# Patient Record
Sex: Male | Born: 1955 | Race: White | Hispanic: No | Marital: Married | Smoking: Never smoker
Health system: Southern US, Community
[De-identification: ages and names within clinical notes are randomized; demographics above are authoritative.]

## PROBLEM LIST (undated history)

## (undated) DIAGNOSIS — I10 Essential (primary) hypertension: Secondary | ICD-10-CM

## (undated) DIAGNOSIS — R569 Unspecified convulsions: Secondary | ICD-10-CM

## (undated) DIAGNOSIS — G40909 Epilepsy, unspecified, not intractable, without status epilepticus: Secondary | ICD-10-CM

## (undated) DIAGNOSIS — K219 Gastro-esophageal reflux disease without esophagitis: Secondary | ICD-10-CM

## (undated) HISTORY — PX: KIDNEY SURGERY: SHX687

---
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
## 2002-07-28 ENCOUNTER — Ambulatory Visit (HOSPITAL_BASED_OUTPATIENT_CLINIC_OR_DEPARTMENT_OTHER): Admission: RE | Admit: 2002-07-28 | Discharge: 2002-07-28 | Payer: Self-pay | Admitting: Urology

## 2005-11-30 ENCOUNTER — Emergency Department (HOSPITAL_COMMUNITY): Admission: EM | Admit: 2005-11-30 | Discharge: 2005-12-01 | Payer: Self-pay | Admitting: Emergency Medicine

## 2007-12-06 ENCOUNTER — Encounter (HOSPITAL_BASED_OUTPATIENT_CLINIC_OR_DEPARTMENT_OTHER): Payer: Medicaid Other

## 2008-01-24 ENCOUNTER — Other Ambulatory Visit (HOSPITAL_BASED_OUTPATIENT_CLINIC_OR_DEPARTMENT_OTHER): Payer: Self-pay | Admitting: Gastroenterology

## 2008-01-24 ENCOUNTER — Encounter (HOSPITAL_BASED_OUTPATIENT_CLINIC_OR_DEPARTMENT_OTHER): Payer: Medicaid Other | Admitting: Registered"

## 2008-01-24 ENCOUNTER — Other Ambulatory Visit (HOSPITAL_BASED_OUTPATIENT_CLINIC_OR_DEPARTMENT_OTHER): Payer: Medicaid Other

## 2008-01-24 DIAGNOSIS — Z01818 Encounter for other preprocedural examination: Secondary | ICD-10-CM

## 2008-01-24 DIAGNOSIS — R188 Other ascites: Secondary | ICD-10-CM

## 2008-01-24 DIAGNOSIS — K769 Liver disease, unspecified: Secondary | ICD-10-CM

## 2008-01-24 DIAGNOSIS — K7030 Alcoholic cirrhosis of liver without ascites: Secondary | ICD-10-CM

## 2008-01-24 LAB — US ABDOMEN DOPPLER COMPLETE

## 2008-05-03 ENCOUNTER — Encounter (HOSPITAL_BASED_OUTPATIENT_CLINIC_OR_DEPARTMENT_OTHER): Payer: Medicaid Other | Admitting: Gastroenterology

## 2008-05-03 DIAGNOSIS — K766 Portal hypertension: Secondary | ICD-10-CM

## 2008-05-03 DIAGNOSIS — K7030 Alcoholic cirrhosis of liver without ascites: Secondary | ICD-10-CM

## 2008-05-03 DIAGNOSIS — R188 Other ascites: Secondary | ICD-10-CM

## 2009-09-13 ENCOUNTER — Other Ambulatory Visit
Admit: 2009-09-13 | Discharge: 2009-09-13 | Disposition: A | Discharge status: Home / Self Care | Payer: Medicaid Other | Attending: Plastic Surgery within the Head & Neck | Admitting: Plastic Surgery within the Head & Neck

## 2009-09-13 ENCOUNTER — Other Ambulatory Visit (HOSPITAL_BASED_OUTPATIENT_CLINIC_OR_DEPARTMENT_OTHER): Payer: Self-pay | Admitting: Plastic Surgery within the Head & Neck

## 2009-09-13 DIAGNOSIS — C779 Secondary and unspecified malignant neoplasm of lymph node, unspecified: Secondary | ICD-10-CM

## 2009-09-16 LAB — PATHOLOGY, SURGICAL

## 2009-10-02 ENCOUNTER — Ambulatory Visit
Payer: Medicaid Other | Attending: Plastic Surgery within the Head & Neck | Admitting: Plastic Surgery within the Head & Neck

## 2009-10-02 DIAGNOSIS — I889 Nonspecific lymphadenitis, unspecified: Secondary | ICD-10-CM | POA: Insufficient documentation

## 2009-10-02 DIAGNOSIS — C090 Malignant neoplasm of tonsillar fossa: Secondary | ICD-10-CM | POA: Insufficient documentation

## 2009-10-10 ENCOUNTER — Ambulatory Visit: Payer: Medicaid Other | Attending: Neurological Surgery | Admitting: Internal Medicine

## 2009-10-10 ENCOUNTER — Ambulatory Visit (HOSPITAL_BASED_OUTPATIENT_CLINIC_OR_DEPARTMENT_OTHER): Payer: Medicaid Other

## 2009-10-10 ENCOUNTER — Ambulatory Visit (HOSPITAL_BASED_OUTPATIENT_CLINIC_OR_DEPARTMENT_OTHER): Payer: Medicaid Other | Admitting: Registered Nurse

## 2009-10-10 ENCOUNTER — Encounter (HOSPITAL_BASED_OUTPATIENT_CLINIC_OR_DEPARTMENT_OTHER): Payer: Medicaid Other | Admitting: Physician Assistant

## 2009-10-10 ENCOUNTER — Other Ambulatory Visit (HOSPITAL_BASED_OUTPATIENT_CLINIC_OR_DEPARTMENT_OTHER): Payer: Self-pay | Admitting: Neurological Surgery

## 2009-10-10 DIAGNOSIS — I10 Essential (primary) hypertension: Secondary | ICD-10-CM

## 2009-10-10 DIAGNOSIS — C090 Malignant neoplasm of tonsillar fossa: Secondary | ICD-10-CM | POA: Insufficient documentation

## 2009-10-10 DIAGNOSIS — Z01818 Encounter for other preprocedural examination: Secondary | ICD-10-CM | POA: Insufficient documentation

## 2009-10-10 DIAGNOSIS — F17200 Nicotine dependence, unspecified, uncomplicated: Secondary | ICD-10-CM | POA: Insufficient documentation

## 2009-10-10 DIAGNOSIS — R220 Localized swelling, mass and lump, head: Secondary | ICD-10-CM | POA: Insufficient documentation

## 2009-10-10 DIAGNOSIS — K7030 Alcoholic cirrhosis of liver without ascites: Secondary | ICD-10-CM | POA: Insufficient documentation

## 2009-10-10 DIAGNOSIS — N200 Calculus of kidney: Secondary | ICD-10-CM | POA: Insufficient documentation

## 2009-10-10 DIAGNOSIS — K219 Gastro-esophageal reflux disease without esophagitis: Secondary | ICD-10-CM | POA: Insufficient documentation

## 2009-10-10 DIAGNOSIS — J984 Other disorders of lung: Secondary | ICD-10-CM | POA: Insufficient documentation

## 2009-10-10 DIAGNOSIS — Z01810 Encounter for preprocedural cardiovascular examination: Secondary | ICD-10-CM | POA: Insufficient documentation

## 2009-10-10 DIAGNOSIS — R599 Enlarged lymph nodes, unspecified: Secondary | ICD-10-CM | POA: Insufficient documentation

## 2009-10-10 DIAGNOSIS — C969 Malignant neoplasm of lymphoid, hematopoietic and related tissue, unspecified: Secondary | ICD-10-CM | POA: Insufficient documentation

## 2009-10-14 LAB — PR DIAGNOSTIC COMPUTED TOMOGRAPHY THORAX W/O CNTRST

## 2009-10-18 ENCOUNTER — Inpatient Hospital Stay (HOSPITAL_COMMUNITY): Payer: Medicaid Other | Admitting: Plastic Surgery within the Head & Neck

## 2009-10-18 ENCOUNTER — Other Ambulatory Visit (HOSPITAL_BASED_OUTPATIENT_CLINIC_OR_DEPARTMENT_OTHER): Payer: Self-pay | Admitting: Plastic Surgery within the Head & Neck

## 2009-10-18 ENCOUNTER — Inpatient Hospital Stay
Admission: RE | Admit: 2009-10-18 | Discharge: 2009-10-21 | DRG: 406 | Disposition: A | Discharge status: Home / Self Care | Payer: Medicaid Other | Attending: Plastic Surgery within the Head & Neck | Admitting: Plastic Surgery within the Head & Neck

## 2009-10-18 DIAGNOSIS — C969 Malignant neoplasm of lymphoid, hematopoietic and related tissue, unspecified: Secondary | ICD-10-CM

## 2009-10-18 DIAGNOSIS — K140 Glossitis: Secondary | ICD-10-CM

## 2009-10-18 DIAGNOSIS — Z880 Allergy status to penicillin: Secondary | ICD-10-CM

## 2009-10-18 DIAGNOSIS — K219 Gastro-esophageal reflux disease without esophagitis: Secondary | ICD-10-CM | POA: Diagnosis present

## 2009-10-18 DIAGNOSIS — C770 Secondary and unspecified malignant neoplasm of lymph nodes of head, face and neck: Secondary | ICD-10-CM

## 2009-10-18 DIAGNOSIS — I10 Essential (primary) hypertension: Secondary | ICD-10-CM | POA: Diagnosis present

## 2009-10-18 DIAGNOSIS — K7030 Alcoholic cirrhosis of liver without ascites: Secondary | ICD-10-CM | POA: Diagnosis present

## 2009-10-18 DIAGNOSIS — F1020 Alcohol dependence, uncomplicated: Secondary | ICD-10-CM | POA: Diagnosis present

## 2009-10-18 DIAGNOSIS — Z87891 Personal history of nicotine dependence: Secondary | ICD-10-CM

## 2009-10-18 DIAGNOSIS — C801 Malignant (primary) neoplasm, unspecified: Secondary | ICD-10-CM | POA: Diagnosis present

## 2009-10-18 DIAGNOSIS — I8390 Asymptomatic varicose veins of unspecified lower extremity: Secondary | ICD-10-CM | POA: Diagnosis present

## 2009-10-18 DIAGNOSIS — Z8669 Personal history of other diseases of the nervous system and sense organs: Secondary | ICD-10-CM

## 2009-10-18 DIAGNOSIS — C099 Malignant neoplasm of tonsil, unspecified: Secondary | ICD-10-CM

## 2009-10-18 DIAGNOSIS — Z87442 Personal history of urinary calculi: Secondary | ICD-10-CM

## 2009-10-19 DIAGNOSIS — I10 Essential (primary) hypertension: Secondary | ICD-10-CM

## 2009-10-19 DIAGNOSIS — K7030 Alcoholic cirrhosis of liver without ascites: Secondary | ICD-10-CM

## 2009-10-25 ENCOUNTER — Encounter (HOSPITAL_BASED_OUTPATIENT_CLINIC_OR_DEPARTMENT_OTHER): Payer: Medicaid Other | Admitting: Registered Nurse

## 2009-10-29 ENCOUNTER — Ambulatory Visit: Payer: Medicaid Other | Attending: Registered Nurse | Admitting: Registered Nurse

## 2009-10-29 DIAGNOSIS — C090 Malignant neoplasm of tonsillar fossa: Secondary | ICD-10-CM | POA: Insufficient documentation

## 2009-10-29 DIAGNOSIS — R220 Localized swelling, mass and lump, head: Secondary | ICD-10-CM | POA: Insufficient documentation

## 2009-11-18 ENCOUNTER — Encounter (HOSPITAL_BASED_OUTPATIENT_CLINIC_OR_DEPARTMENT_OTHER): Payer: Medicaid Other | Admitting: Registered Nurse

## 2009-11-25 ENCOUNTER — Ambulatory Visit: Payer: Medicaid Other | Attending: Registered Nurse | Admitting: Registered Nurse

## 2009-11-25 DIAGNOSIS — C090 Malignant neoplasm of tonsillar fossa: Secondary | ICD-10-CM | POA: Insufficient documentation

## 2009-11-25 DIAGNOSIS — Z09 Encounter for follow-up examination after completed treatment for conditions other than malignant neoplasm: Secondary | ICD-10-CM | POA: Insufficient documentation

## 2010-08-19 ENCOUNTER — Ambulatory Visit
Payer: Medicaid Other | Attending: Plastic Surgery within the Head & Neck | Admitting: Plastic Surgery within the Head & Neck

## 2010-08-19 DIAGNOSIS — Z5189 Encounter for other specified aftercare: Secondary | ICD-10-CM | POA: Insufficient documentation

## 2010-08-19 DIAGNOSIS — Z85819 Personal history of malignant neoplasm of unspecified site of lip, oral cavity, and pharynx: Secondary | ICD-10-CM

## 2010-12-17 ENCOUNTER — Ambulatory Visit (HOSPITAL_BASED_OUTPATIENT_CLINIC_OR_DEPARTMENT_OTHER): Admit: 2010-12-17 | Discharge: 2010-12-17 | Disposition: A | Discharge status: Home / Self Care | Payer: Self-pay

## 2011-01-13 ENCOUNTER — Ambulatory Visit: Payer: No Typology Code available for payment source | Attending: Otolaryngology | Admitting: Otolaryngology

## 2011-01-13 ENCOUNTER — Ambulatory Visit (HOSPITAL_BASED_OUTPATIENT_CLINIC_OR_DEPARTMENT_OTHER): Payer: Medicaid Other | Admitting: Otolaryngology

## 2011-01-13 DIAGNOSIS — M5382 Other specified dorsopathies, cervical region: Secondary | ICD-10-CM | POA: Insufficient documentation

## 2011-01-13 DIAGNOSIS — M6281 Muscle weakness (generalized): Secondary | ICD-10-CM

## 2011-01-30 ENCOUNTER — Encounter: Payer: Self-pay | Admitting: *Deleted

## 2011-01-30 ENCOUNTER — Inpatient Hospital Stay (HOSPITAL_COMMUNITY)
Admission: AD | Admit: 2011-01-30 | Discharge: 2011-02-03 | DRG: 445 | Disposition: A | Discharge status: Home / Self Care | Payer: PRIVATE HEALTH INSURANCE | Source: Other Acute Inpatient Hospital | Attending: Internal Medicine | Admitting: Internal Medicine

## 2011-01-30 ENCOUNTER — Emergency Department (INDEPENDENT_AMBULATORY_CARE_PROVIDER_SITE_OTHER): Payer: PRIVATE HEALTH INSURANCE

## 2011-01-30 ENCOUNTER — Other Ambulatory Visit: Payer: Self-pay

## 2011-01-30 ENCOUNTER — Emergency Department (HOSPITAL_BASED_OUTPATIENT_CLINIC_OR_DEPARTMENT_OTHER)
Admission: EM | Admit: 2011-01-30 | Discharge: 2011-01-30 | Disposition: A | Discharge status: Other Acute Inpatient Hospital | Payer: PRIVATE HEALTH INSURANCE | Source: Home / Self Care | Attending: Emergency Medicine | Admitting: Emergency Medicine

## 2011-01-30 DIAGNOSIS — I10 Essential (primary) hypertension: Secondary | ICD-10-CM | POA: Diagnosis present

## 2011-01-30 DIAGNOSIS — K810 Acute cholecystitis: Principal | ICD-10-CM | POA: Diagnosis present

## 2011-01-30 DIAGNOSIS — K828 Other specified diseases of gallbladder: Secondary | ICD-10-CM | POA: Diagnosis present

## 2011-01-30 DIAGNOSIS — R0789 Other chest pain: Secondary | ICD-10-CM | POA: Diagnosis present

## 2011-01-30 DIAGNOSIS — R0602 Shortness of breath: Secondary | ICD-10-CM | POA: Insufficient documentation

## 2011-01-30 DIAGNOSIS — Z79899 Other long term (current) drug therapy: Secondary | ICD-10-CM

## 2011-01-30 DIAGNOSIS — D72829 Elevated white blood cell count, unspecified: Secondary | ICD-10-CM | POA: Diagnosis present

## 2011-01-30 DIAGNOSIS — G40909 Epilepsy, unspecified, not intractable, without status epilepticus: Secondary | ICD-10-CM | POA: Diagnosis present

## 2011-01-30 DIAGNOSIS — R079 Chest pain, unspecified: Secondary | ICD-10-CM

## 2011-01-30 DIAGNOSIS — K219 Gastro-esophageal reflux disease without esophagitis: Secondary | ICD-10-CM | POA: Diagnosis present

## 2011-01-30 DIAGNOSIS — N179 Acute kidney failure, unspecified: Secondary | ICD-10-CM | POA: Diagnosis present

## 2011-01-30 DIAGNOSIS — I517 Cardiomegaly: Secondary | ICD-10-CM

## 2011-01-30 HISTORY — DX: Essential (primary) hypertension: I10

## 2011-01-30 HISTORY — DX: Unspecified convulsions: R56.9

## 2011-01-30 HISTORY — DX: Gastro-esophageal reflux disease without esophagitis: K21.9

## 2011-01-30 LAB — BASIC METABOLIC PANEL
BUN: 11 mg/dL (ref 6–23)
CO2: 25 mEq/L (ref 19–32)
Calcium: 9.4 mg/dL (ref 8.4–10.5)
Chloride: 102 mEq/L (ref 96–112)
Creatinine, Ser: 1.1 mg/dL (ref 0.50–1.35)

## 2011-01-30 LAB — DIFFERENTIAL
Basophils Absolute: 0 10*3/uL (ref 0.0–0.1)
Basophils Relative: 0 % (ref 0–1)
Eosinophils Relative: 3 % (ref 0–5)
Lymphocytes Relative: 35 % (ref 12–46)
Monocytes Absolute: 1 10*3/uL (ref 0.1–1.0)
Monocytes Relative: 9 % (ref 3–12)

## 2011-01-30 LAB — CBC
HCT: 44.6 % (ref 39.0–52.0)
Hemoglobin: 15.5 g/dL (ref 13.0–17.0)
MCHC: 34.8 g/dL (ref 30.0–36.0)
MCV: 87.8 fL (ref 78.0–100.0)
RDW: 12.9 % (ref 11.5–15.5)

## 2011-01-30 LAB — D-DIMER, QUANTITATIVE: D-Dimer, Quant: <0.22 ug/mL-FEU (ref 0.00–0.48)

## 2011-01-30 LAB — CARDIAC PANEL(CRET KIN+CKTOT+MB+TROPI)
Relative Index: INVALID (ref 0.0–2.5)
Total CK: 98 U/L (ref 7–232)

## 2011-01-30 MED ORDER — KETOROLAC TROMETHAMINE 30 MG/ML IJ SOLN
INTRAMUSCULAR | Status: AC
  Administered 2011-01-30: 30 mg
  Filled 2011-01-30: qty 1

## 2011-01-30 MED ORDER — MORPHINE SULFATE 4 MG/ML IJ SOLN
4.0000 mg | Freq: Once | INTRAMUSCULAR | Status: AC
  Administered 2011-01-30: 4 mg via INTRAVENOUS
  Filled 2011-01-30: qty 1

## 2011-01-30 MED ORDER — ONDANSETRON HCL 4 MG/2ML IJ SOLN
4.0000 mg | Freq: Once | INTRAMUSCULAR | Status: AC
  Administered 2011-01-30: 4 mg via INTRAVENOUS
  Filled 2011-01-30 (×2): qty 2

## 2011-01-30 MED ORDER — LORAZEPAM 2 MG/ML IJ SOLN
0.5000 mg | Freq: Once | INTRAMUSCULAR | Status: AC
  Administered 2011-01-30: 0.5 mg via INTRAVENOUS
  Filled 2011-01-30: qty 1

## 2011-01-30 MED ORDER — HYDROCODONE-ACETAMINOPHEN 5-325 MG PO TABS: 2.0000 | ORAL_TABLET | ORAL | Status: AC | PRN

## 2011-01-30 MED ORDER — MORPHINE SULFATE 4 MG/ML IJ SOLN
INTRAMUSCULAR | Status: AC
  Filled 2011-01-30: qty 1

## 2011-01-30 MED ORDER — MORPHINE SULFATE 4 MG/ML IJ SOLN
4.0000 mg | Freq: Once | INTRAMUSCULAR | Status: AC
  Administered 2011-01-30: 4 mg via INTRAVENOUS

## 2011-01-30 MED ORDER — HYDROMORPHONE HCL 1 MG/ML IJ SOLN: 1.0000 mg | Freq: Once | INTRAMUSCULAR | Status: DC

## 2011-01-30 MED ORDER — ONDANSETRON HCL 4 MG/2ML IJ SOLN
INTRAMUSCULAR | Status: AC
  Administered 2011-01-30: 4 mg via INTRAVENOUS
  Filled 2011-01-30: qty 2

## 2011-01-30 MED ORDER — ASPIRIN 81 MG PO CHEW
324.0000 mg | CHEWABLE_TABLET | Freq: Once | ORAL | Status: AC
  Administered 2011-01-30: 324 mg via ORAL
  Filled 2011-01-30: qty 4

## 2011-01-30 MED ORDER — KETOROLAC TROMETHAMINE 30 MG/ML IJ SOLN
30.0000 mg | Freq: Once | INTRAMUSCULAR | Status: AC
  Administered 2011-01-30: 30 mg via INTRAVENOUS
  Filled 2011-01-30: qty 1

## 2011-01-30 MED ORDER — ONDANSETRON HCL 4 MG/2ML IJ SOLN
4.0000 mg | Freq: Once | INTRAMUSCULAR | Status: AC
  Administered 2011-01-30: 4 mg via INTRAVENOUS

## 2011-01-30 NOTE — ED Notes (Signed)
Pt states he coughed and had a sharp pain in his right anterior chest.

## 2011-01-30 NOTE — ED Provider Notes (Signed)
History     CSN: [id number] Arrival date & time: 01/30/2011 11:07 AM  Chief Complaint  Patient presents with  . Chest Pain     HPI 55 year-old male history of seizure disorder number hypertension presents with chest pain. Patient complaining of sudden onset left-sided sharp chest pressure with radiation to his bilateral jaws and "head " since about 40 minutes prior to arrival. He states that his chest pain is associated with shortness of breath. He denies nausea, vomiting, diaphoresis. He denies radiation of pain to his back. He denies fever, chills, cough. Denies history of similar. Does have history of GERD but states that this is different. Also has a history of anxiety and states that this is different from that as well. He does report that he is under increased stress at his job. He owns 2 businesses. He denies numbness, tingling, weakness of his extremities. Denies h/o VTE in self or family. No recent hosp/surg/immob. No h/o cancer. Denies exogenous hormone use, no leg pain or swelling. Has been compiled with his antiepileptics last seizure was over a year ago.  Past Medical History  Diagnosis Date  . Seizures   . Hypertension   . GERD (gastroesophageal reflux disease)     Past Surgical History  Procedure Date  . Kidney surgery     No family history on file.  History  Substance Use Topics  . Smoking status: Never Smoker   . Smokeless tobacco: Not on file  . Alcohol Use: No      Review of Systems Negative except as noted history of present illness  Allergies  Review of patient's allergies indicates no known allergies.  Home Medications   Current Outpatient Rx  Name Route Sig Dispense Refill  . LAMICTAL PO Oral Take by mouth.      Marland Kitchen KEPPRA PO Oral Take by mouth.      . RABEPRAZOLE SODIUM 20 MG PO TBEC Oral Take 20 mg by mouth daily.        BP 120/92  Pulse 92  Temp(Src) 97.9 F (36.6 C) (Oral)  Resp 24  SpO2 97%  Physical Exam  Nursing note and vitals  reviewed. Constitutional: He is oriented to person, place, and time. He appears well-developed and well-nourished. No distress.       Appears anxious  HENT:  Head: Atraumatic.       His membranes are dry posterior oropharynx unremarkable  Eyes: Conjunctivae are normal. Pupils are equal, round, and reactive to light.  Neck: Neck supple.  Cardiovascular: Normal rate, regular rhythm, normal heart sounds and intact distal pulses.  Exam reveals no gallop and no friction rub.   No murmur heard. Pulmonary/Chest: Effort normal. No respiratory distress. He has no wheezes. He has no rales. He exhibits tenderness.       Left-sided chest tenderness which he states does produce his pain somewhat. He has worsening pain when he moves during physical exam.  Abdominal: Soft. Bowel sounds are normal. There is no tenderness. There is no rebound and no guarding.  Musculoskeletal: Normal range of motion. He exhibits no edema and no tenderness.  Neurological: He is alert and oriented to person, place, and time. No cranial nerve deficit. He exhibits normal muscle tone. Coordination normal.  Skin: Skin is warm and dry. No rash noted.  Psychiatric:       Anxious appearing    ED Course  Procedures (including critical care time)  Labs Reviewed  CBC - Abnormal; Notable for the following:  WBC 11.2 (*)    All other components within normal limits  BASIC METABOLIC PANEL - Abnormal; Notable for the following:    Glucose, Bld 147 (*)    GFR calc non Af Amer 75 (*)    GFR calc Af Amer 87 (*)    All other components within normal limits  DIFFERENTIAL  CARDIAC PANEL(CRET KIN+CKTOT+MB+TROPI)  D-DIMER, QUANTITATIVE   Dg Chest 2 View  01/30/2011  *RADIOLOGY REPORT*  Clinical Data: Chest pain, shortness of breath, hypertension  CHEST - 2 VIEW  Comparison: None.  Findings: The lungs are clear.  Mediastinal contours appear normal. The heart is within upper limits of normal.  There are degenerative changes throughout  the thoracic spine.  IMPRESSION: No active lung disease.  Borderline cardiomegaly.  Original Report Authenticated By: Juline Patch, M.D.     No diagnosis found.   Date: 01/30/2011  Rate: 92   Rhythm: normal sinus rhythm  QRS Axis: normal  Intervals: normal  ST/T Wave abnormalities: normal  Conduction Disutrbances:none  Narrative Interpretation:   Old EKG Reviewed: unchanged    MDM  55 year old male history of anxiety under increased stress. Presents with left-sided chest pain. Ordered chest x-ray, labs. EKG unremarkable. I suspect that his chest pain is musculoskeletal and may be related to stress and anxiety. He has a TIMI score of 0. Will reassess after morphine and Ativan.  Stefano Gaul, MD  12:28 PM labs reviewed and unremarkable including cardiac enzymes. The patient is PERC negative and has a negative d-dimer.  3:28 PM  patient has feels somewhat improved after toradol. Will check second set of cardiac enzymes. Home if negative with PMD f/u.   Pt signed out to Dr. Juleen China. F/u CE. Home if negative with pain medication.  Stefano Gaul, MD        Forbes Cellar, MD 01/30/11 [phone]

## 2011-01-30 NOTE — ED Notes (Signed)
Report given to [facility] 

## 2011-01-30 NOTE — ED Provider Notes (Signed)
  Physical Exam  BP 105/77  Pulse 88  Temp(Src) 97.9 F (36.6 C) (Oral)  Resp 24  SpO2 95%  Physical Exam  ED Course  Procedures  MDM 54yM with ongoing CP. Initial EKG nondiagnostic and repeat without significant change. CEs wnl x2. D-dimer neg.  Suspect some component of anxiety. Explained to pt and wife that feel that low risk CP and w/u unremarkable. Discussed pursuiting provocative testing as outpt. Pt states that CP is now getting worse and would "feel safer" if admitted. Will discuss with medicine.    Raeford Razor, MD 01/30/11 1750

## 2011-01-30 NOTE — ED Notes (Signed)
Sudden onset chest pain, headache and sob. Pale on arrival. EKG and cardiac monitor on arrival to tx room. States he has been under increased stress with work.

## 2011-01-31 LAB — CARDIAC PANEL(CRET KIN+CKTOT+MB+TROPI)
CK, MB: 2.2 ng/mL (ref 0.3–4.0)
CK, MB: 2.1 ng/mL (ref 0.3–4.0)
Relative Index: INVALID (ref 0.0–2.5)
Total CK: 47 U/L (ref 7–232)
Total CK: 45 U/L (ref 7–232)
Total CK: 47 U/L (ref 7–232)
Troponin I: <0.3 ng/mL (ref <0.30)

## 2011-01-31 LAB — CBC
HCT: 42 % (ref 39.0–52.0)
MCHC: 33.6 g/dL (ref 30.0–36.0)
Platelets: 220 10*3/uL (ref 150–400)
RDW: 13.3 % (ref 11.5–15.5)
WBC: 14.1 10*3/uL — ABNORMAL HIGH (ref 4.0–10.5)

## 2011-01-31 LAB — COMPREHENSIVE METABOLIC PANEL
Albumin: 4.1 g/dL (ref 3.5–5.2)
BUN: 23 mg/dL (ref 6–23)
CO2: 28 mEq/L (ref 19–32)
Calcium: 9.7 mg/dL (ref 8.4–10.5)
Chloride: 101 mEq/L (ref 96–112)
Creatinine, Ser: 1.45 mg/dL — ABNORMAL HIGH (ref 0.50–1.35)
GFR calc non Af Amer: 54 mL/min — ABNORMAL LOW (ref >90)
Total Bilirubin: 0.7 mg/dL (ref 0.3–1.2)

## 2011-01-31 LAB — GLUCOSE, CAPILLARY: Glucose-Capillary: 119 mg/dL — ABNORMAL HIGH (ref 70–99)

## 2011-01-31 LAB — LIPASE, BLOOD: Lipase: 51 U/L (ref 11–59)

## 2011-02-01 ENCOUNTER — Inpatient Hospital Stay (HOSPITAL_COMMUNITY): Payer: PRIVATE HEALTH INSURANCE

## 2011-02-01 LAB — CBC
Platelets: 173 10*3/uL (ref 150–400)
RDW: 12.9 % (ref 11.5–15.5)
WBC: 11.4 10*3/uL — ABNORMAL HIGH (ref 4.0–10.5)

## 2011-02-01 LAB — RAPID URINE DRUG SCREEN, HOSP PERFORMED
Barbiturates: NOT DETECTED
Cocaine: NOT DETECTED
Tetrahydrocannabinol: POSITIVE — AB

## 2011-02-01 LAB — BASIC METABOLIC PANEL
Chloride: 101 mEq/L (ref 96–112)
GFR calc Af Amer: 74 mL/min — ABNORMAL LOW (ref >90)
Potassium: 4.3 mEq/L (ref 3.5–5.1)
Sodium: 138 mEq/L (ref 135–145)

## 2011-02-01 LAB — LIPID PANEL
Cholesterol: 138 mg/dL (ref 0–200)
HDL: 37 mg/dL — ABNORMAL LOW (ref >39)
Total CHOL/HDL Ratio: 3.7 RATIO
Triglycerides: 84 mg/dL (ref <150)

## 2011-02-02 ENCOUNTER — Inpatient Hospital Stay (HOSPITAL_COMMUNITY): Payer: PRIVATE HEALTH INSURANCE

## 2011-02-02 LAB — BASIC METABOLIC PANEL
GFR calc Af Amer: 79 mL/min — ABNORMAL LOW (ref >90)
GFR calc non Af Amer: 68 mL/min — ABNORMAL LOW (ref >90)
Glucose, Bld: 94 mg/dL (ref 70–99)
Potassium: 4 mEq/L (ref 3.5–5.1)
Sodium: 140 mEq/L (ref 135–145)

## 2011-02-02 LAB — CBC
Hemoglobin: 12.9 g/dL — ABNORMAL LOW (ref 13.0–17.0)
MCHC: 33.9 g/dL (ref 30.0–36.0)
RBC: 4.24 MIL/uL (ref 4.22–5.81)
WBC: 8.3 10*3/uL (ref 4.0–10.5)

## 2011-02-02 MED ORDER — TECHNETIUM TC 99M MEBROFENIN IV KIT
5.0000 | PACK | Freq: Once | INTRAVENOUS | Status: AC | PRN
  Administered 2011-02-02: 5 via INTRAVENOUS

## 2011-02-12 ENCOUNTER — Encounter (INDEPENDENT_AMBULATORY_CARE_PROVIDER_SITE_OTHER): Payer: Self-pay | Admitting: General Surgery

## 2011-02-12 ENCOUNTER — Ambulatory Visit (INDEPENDENT_AMBULATORY_CARE_PROVIDER_SITE_OTHER): Payer: PRIVATE HEALTH INSURANCE | Admitting: General Surgery

## 2011-02-12 VITALS — BP 132/86 | HR 70 | Temp 97.1°F | Resp 16 | Ht 72.0 in | Wt 168.6 lb

## 2011-02-12 DIAGNOSIS — K8020 Calculus of gallbladder without cholecystitis without obstruction: Secondary | ICD-10-CM

## 2011-02-12 DIAGNOSIS — K828 Other specified diseases of gallbladder: Secondary | ICD-10-CM

## 2011-02-12 NOTE — Patient Instructions (Signed)
Call us at [phone].  Stop taking the antibiotics        Laparoscopic Cholecystectomy Care After Refer to this sheet in the next few weeks. These instructions provide you with information on caring for yourself after your procedure. Your caregiver may also give you more specific instructions. Your treatment has been planned according to current medical practices, but problems sometimes occur. Call your caregiver if you have any problems or questions after your procedure. HOME CARE INSTRUCTIONS   Change bandages (dressings) as directed by your caregiver.   Keep the wound dry and clean. The wound may be washed gently with soap and water. Gently blot or dab the area dry.   Do not take baths or use swimming pools or hot tubs for 10 days, or as instructed by your caregiver.   Only take over-the-counter or prescription medicines for pain, discomfort, or fever as directed by your caregiver.   Continue your normal diet as directed by your caregiver.   Do not lift anything heavier than 25 pounds (11.5 kg), or as directed by your caregiver.   Do not play contact sports for 1 week, or as directed by your caregiver.  SEEK MEDICAL CARE IF:   There is redness, swelling, or increasing pain in the wound.   You notice yellowish-white fluid (pus) coming from the wound.   There is drainage from the wound that lasts longer than 1 day.   There is a bad smell coming from the wound or dressing.   The surgical cut (incision) breaks open.  SEEK IMMEDIATE MEDICAL CARE IF:   You develop a rash.   You have difficulty breathing.   You develop chest pain.   You develop any reaction or side effects to medicines given.   You have a fever.   You have increasing pain in the shoulders (shoulder strap areas).   You have dizzy episodes or faint while standing.   You develop severe abdominal pain.   You feel sick to your stomach (nauseous) or throw up (vomit) and this lasts for more than 1 day.  MAKE  SURE YOU:   Understand these instructions.   Will watch your condition.   Will get help right away if you are not doing well or get worse.  Document Released: 04/13/2005 Document Revised: 12/24/2010 Document Reviewed: 09/26/2010 [facility] Patient Information 2012 [place], [place].

## 2011-02-13 ENCOUNTER — Encounter (INDEPENDENT_AMBULATORY_CARE_PROVIDER_SITE_OTHER): Payer: Self-pay | Admitting: General Surgery

## 2011-02-13 NOTE — Progress Notes (Signed)
Chief Complaint  Patient presents with  . Other    Evaluate gallbladder    HPI Timothy Middleton is a 55 y.o. male.   HPI  55 year old Caucasian male referred by Dr. Lenise Arena for evaluation of a possible cholecystectomy. Around October 5, the patient developed acute onset left sided chest pain that radiated to his jaw. He also developed a headache. He thought he was having a heart attack. He went to [facility] and was subsequently and transferred to [facility] He was admitted by the hospitalists. He underwent serial cardiac enzymes monitoring which were all negative as well as a 12-lead EKG which demonstrated no changes. He was ruled out for myocardial infarction. However he did develop some right-sided abdominal pain. He also developed one episode of nausea and vomiting. He also states he had a large amount of belching. An ultrasound of his abdomen was performed followed by HIDA scan.  He denies any dysphasia, acholic stools, diarrhea, constipation, melena, or hematochezia. He denies any NSAIDs use. He does take medicine for reflux. He has not had any additional attacks since discharge. He denies any jaundice.  Past Medical History  Diagnosis Date  . Hypertension   . GERD (gastroesophageal reflux disease)   . Seizures     epileptic    Past Surgical History  Procedure Date  . Kidney surgery   . Ureterectomy 1964    No family history on file.  Social History History  Substance Use Topics  . Smoking status: Never Smoker   . Smokeless tobacco: Not on file  . Alcohol Use: No  Positive THC use.  No Known Allergies  Current Outpatient Prescriptions  Medication Sig Dispense Refill  . ciprofloxacin (CIPRO) 500 MG tablet       . HYDROcodone-acetaminophen (NORCO) 5-325 MG per tablet       . metroNIDAZOLE (FLAGYL) 500 MG tablet       . MICARDIS 80 MG tablet       . omeprazole (PRILOSEC) 20 MG capsule       . ALPRAZolam (XANAX) 0.5 MG tablet Take 0.125 mg by mouth  daily as needed.        . LamoTRIgine (LAMICTAL XR) 300 MG TB24 Take 1 tablet by mouth 2 (two) times daily.        Marland Kitchen levETIRAcetam (KEPPRA) 500 MG tablet Take 1,000 mg by mouth every 12 (twelve) hours.        . Multiple Vitamin (MULTIVITAMIN) tablet Take 1 tablet by mouth daily.        . RABEprazole (ACIPHEX) 20 MG tablet Take 20 mg by mouth daily as needed. For acid reflux or a large amount of acid        Review of Systems Review of Systems  Constitutional: Negative for fever, chills, appetite change and unexpected weight change (has lost 15lbs (planned)).  HENT: Negative for hearing loss, congestion, sore throat, trouble swallowing and voice change.   Eyes: Negative for visual disturbance.  Respiratory: Negative for cough and wheezing.   Cardiovascular: Negative for palpitations and leg swelling.  Gastrointestinal: Positive for abdominal pain. Negative for diarrhea, constipation, blood in stool, abdominal distention, anal bleeding and rectal pain.  Genitourinary: Negative for hematuria and difficulty urinating.  Musculoskeletal: Negative for arthralgias.  Skin: Negative for rash and wound.  Neurological: Positive for seizures (last sz many yrs ago). Negative for syncope, weakness and headaches.  Hematological: Negative for adenopathy. Does not bruise/bleed easily.  Psychiatric/Behavioral: Negative for confusion.    Blood  pressure 132/86, pulse 70, temperature 97.1 F (36.2 C), temperature source Temporal, resp. rate 16, height 6' (1.829 m), weight 168 lb 9.6 oz (76.476 kg).  Physical Exam Physical Exam  Vitals reviewed. Constitutional: He is oriented to person, place, and time. He appears well-developed and well-nourished. No distress.  HENT:  Head: Normocephalic and atraumatic.  Eyes: Conjunctivae are normal. No scleral icterus.  Neck: Normal range of motion. Neck supple. No thyromegaly present.  Cardiovascular: Normal rate, regular rhythm and normal heart sounds.     Pulmonary/Chest: Effort normal and breath sounds normal. No respiratory distress. He has no rales.  Abdominal: Soft. Bowel sounds are normal. He exhibits no distension. There is no tenderness. There is no guarding.  Musculoskeletal: Normal range of motion. He exhibits no edema.  Lymphadenopathy:    He has no cervical adenopathy.  Neurological: He is alert and oriented to person, place, and time. He exhibits normal muscle tone.  Skin: Skin is warm and dry.  Psychiatric: He has a normal mood and affect. His behavior is normal. Judgment and thought content normal.    Data Reviewed NUCLEAR MEDICINE HEPATOBILIARY IMAGING WITH GALLBLADDER EF   Technique: Sequential images of the abdomen were obtained out to 60  minutes following intravenous administration of  radiopharmaceutical. After the slow intravenous infusion of 1.58  uCg Cholecystokinin, the gallbladder ejection fraction was  determined.  Radiopharmaceutical: 5.0 mCi Tc-67m Choletec  Comparison: Abdominal ultrasound 02/01/2011.   Findings: Initial images demonstrate homogenous hepatic activity  and prompt visualization of the gallbladder and biliary system.  The stimulated portion of the study demonstrates limited  gallbladder contraction. There is progressive small bowel  activity. The gallbladder ejection fraction calculated at  approximately 30 minutes is 17.9%. Normal ejection fractions are  considered greater than 30%.  The patient did not experience symptoms during CCK administration.   IMPRESSION:  1. The cystic and common bile ducts are patent.  2. There is a decreased gallbladder ejection fraction of 18%  suggesting biliary dyskinesia. The patient did not have  progressive symptoms with CCK administration.   ABDOMINAL ULTRASOUND COMPLETE  Comparison: None  Findings:   Gallbladder: A 2.5 cm mobile gallstone is identified. There is  gallbladder wall thickening but no sonographic Murphy's sign or  pericholecystic  fluid.   Common Bile Duct: There is no evidence of intrahepatic or  extrahepatic biliary dilation. The CBD measures 5.0 mm in greatest  diameter.   Liver: The liver is within normal limits in parenchymal  echogenicity. No focal abnormalities are identified.  IVC: Appears normal.  Pancreas: Although the pancreas is difficult to visualize in its  entirety, no focal pancreatic abnormality is identified.  Spleen: Within normal limits in size and echotexture.  Right kidney: The right kidney is normal in size and parenchymal  echogenicity. There is no evidence of solid mass, hydronephrosis  or definite renal calculi. A 3 cm right upper pole cyst is present.  The right kidney measures 11.9 cm.  Left kidney: The left kidney is normal in size and parenchymal  echogenicity. There is no evidence of solid mass, hydronephrosis  or definite renal calculi. The left kidney measures 12.5 cm.  Abdominal Aorta: No abdominal aortic aneurysm identified.  There is no evidence of ascites.   IMPRESSION: 2.5 cm gallstone with gallbladder wall thickening. This  wall thickening is suspicious for acute cholecystitis but the  gallstone is mobile.  No evidence of biliary dilatation.  CBC    Component Value Date/Time   WBC 8.3 02/02/2011  0635   RBC 4.24 02/02/2011 0635   HGB 12.9* 02/02/2011 0635   HCT 38.1* 02/02/2011 0635   PLT 185 02/02/2011 0635   MCV 89.9 02/02/2011 0635   MCH 30.4 02/02/2011 0635   MCHC 33.9 02/02/2011 0635   RDW 12.9 02/02/2011 0635   LYMPHSABS 4.0 01/30/2011 1111   MONOABS 1.0 01/30/2011 1111   EOSABS 0.4 01/30/2011 1111   BASOSABS 0.0 01/30/2011 1111    CMP     Component Value Date/Time   NA 140 02/02/2011 0635   K 4.0 02/02/2011 0635   CL 99 02/02/2011 0635   CO2 28 02/02/2011 0635   GLUCOSE 94 02/02/2011 0635   BUN 12 02/02/2011 0635   CREATININE 1.20 02/02/2011 0635   CALCIUM 9.1 02/02/2011 0635   PROT 6.6 01/31/2011 1116   ALBUMIN 4.1 01/31/2011 1116   AST 14 01/31/2011 1116   ALT  15 01/31/2011 1116   ALKPHOS 63 01/31/2011 1116   BILITOT 0.7 01/31/2011 1116   GFRNONAA 68* 02/02/2011 0635   GFRAA 79* 02/02/2011 0635   cip's neg x 3   Assessment    Biliary dyskinesia/Chronic Cholecystitis Cholelithiasis    Plan    We discussed gallbladder disease.  We discussed operative and nonoperative management such as diet modification. The pt has elected to proceed with laparoscopic cholecystectomy with cholangiogram.  I discussed the procedure in detail.  The patient was given Agricultural engineer.  We discussed the risks and benefits of a laparoscopic cholecystectomy including, but not limited to bleeding, infection, injury to surrounding structures such as the intestine or liver, bile leak, retained gallstones, need to convert to an open procedure, prolonged diarrhea, blood clots such as  DVT, common bile duct injury, anesthesia risks, and possible need for additional procedures.  We discussed the typical post-operative recovery course. I explained that the likelihood of improvement of their symptoms is good.  Mary Sella. Andrey Campanile, MD, FACS        Gaynelle Adu M 02/13/2011, 4:31 PM

## 2011-02-17 ENCOUNTER — Encounter (HOSPITAL_BASED_OUTPATIENT_CLINIC_OR_DEPARTMENT_OTHER): Payer: Medicaid Other | Admitting: Plastic Surgery within the Head & Neck

## 2011-02-17 ENCOUNTER — Encounter (HOSPITAL_COMMUNITY)
Admission: RE | Admit: 2011-02-17 | Discharge: 2011-02-17 | Disposition: A | Discharge status: Home / Self Care | Payer: PRIVATE HEALTH INSURANCE | Source: Ambulatory Visit | Attending: General Surgery | Admitting: General Surgery

## 2011-02-17 LAB — COMPREHENSIVE METABOLIC PANEL
Alkaline Phosphatase: 77 U/L (ref 39–117)
BUN: 13 mg/dL (ref 6–23)
CO2: 31 mEq/L (ref 19–32)
Chloride: 102 mEq/L (ref 96–112)
GFR calc Af Amer: 89 mL/min — ABNORMAL LOW (ref >90)
Glucose, Bld: 89 mg/dL (ref 70–99)
Potassium: 4.7 mEq/L (ref 3.5–5.1)
Total Bilirubin: 1.1 mg/dL (ref 0.3–1.2)

## 2011-02-17 LAB — DIFFERENTIAL
Basophils Absolute: 0 10*3/uL (ref 0.0–0.1)
Lymphocytes Relative: 11 % — ABNORMAL LOW (ref 12–46)
Monocytes Absolute: 1 10*3/uL (ref 0.1–1.0)
Neutro Abs: 8.7 10*3/uL — ABNORMAL HIGH (ref 1.7–7.7)

## 2011-02-17 LAB — CBC
HCT: 43.2 % (ref 39.0–52.0)
Hemoglobin: 14.7 g/dL (ref 13.0–17.0)
WBC: 11.1 10*3/uL — ABNORMAL HIGH (ref 4.0–10.5)

## 2011-02-17 LAB — SURGICAL PCR SCREEN: MRSA, PCR: NEGATIVE

## 2011-02-18 ENCOUNTER — Telehealth (INDEPENDENT_AMBULATORY_CARE_PROVIDER_SITE_OTHER): Payer: Self-pay | Admitting: General Surgery

## 2011-02-18 NOTE — Telephone Encounter (Signed)
Labs okay for surgery faxed to pre-op.  

## 2011-02-20 ENCOUNTER — Ambulatory Visit (HOSPITAL_COMMUNITY): Payer: PRIVATE HEALTH INSURANCE

## 2011-02-20 ENCOUNTER — Other Ambulatory Visit (INDEPENDENT_AMBULATORY_CARE_PROVIDER_SITE_OTHER): Payer: Self-pay | Admitting: General Surgery

## 2011-02-20 ENCOUNTER — Ambulatory Visit (HOSPITAL_COMMUNITY)
Admission: RE | Admit: 2011-02-20 | Discharge: 2011-02-20 | Disposition: A | Discharge status: Home / Self Care | Payer: PRIVATE HEALTH INSURANCE | Source: Ambulatory Visit | Attending: General Surgery | Admitting: General Surgery

## 2011-02-20 DIAGNOSIS — K8020 Calculus of gallbladder without cholecystitis without obstruction: Secondary | ICD-10-CM | POA: Insufficient documentation

## 2011-02-20 DIAGNOSIS — K8010 Calculus of gallbladder with chronic cholecystitis without obstruction: Secondary | ICD-10-CM

## 2011-02-20 DIAGNOSIS — K828 Other specified diseases of gallbladder: Secondary | ICD-10-CM | POA: Insufficient documentation

## 2011-02-20 DIAGNOSIS — G40909 Epilepsy, unspecified, not intractable, without status epilepticus: Secondary | ICD-10-CM | POA: Insufficient documentation

## 2011-02-20 DIAGNOSIS — K219 Gastro-esophageal reflux disease without esophagitis: Secondary | ICD-10-CM | POA: Insufficient documentation

## 2011-02-20 DIAGNOSIS — I10 Essential (primary) hypertension: Secondary | ICD-10-CM | POA: Insufficient documentation

## 2011-02-20 DIAGNOSIS — Z01812 Encounter for preprocedural laboratory examination: Secondary | ICD-10-CM | POA: Insufficient documentation

## 2011-02-20 HISTORY — PX: LAPAROSCOPIC CHOLECYSTECTOMY W/ CHOLANGIOGRAPHY: SUR757

## 2011-02-28 NOTE — Op Note (Signed)
NAME:  ZAIAH, ECKERSON NO.:  [id number]  MEDICAL RECORD NO.:  [id number]  LOCATION:  [facility]                         FACILITY:  [facility]  PHYSICIAN:  Mary Sella. Andrey Campanile, MD     DATE OF BIRTH:  [birth year]  DATE OF PROCEDURE:  02/20/2011 DATE OF DISCHARGE:                              OPERATIVE REPORT   PREOPERATIVE DIAGNOSES: 1. Cholelithiasis. 2. Biliary dyskinesia.  POSTOPERATIVE DIAGNOSES: 1. Cholelithiasis. 2. Biliary dyskinesia.  PROCEDURE:  Laparoscopic cholecystectomy with intraoperative cholangiogram.  SURGEON:  Mary Sella. Andrey Campanile, MD  ASSISTANT SURGEON:  None.  ANESTHESIA:  General plus 27 mL of 0.25% Marcaine with epi.  SPECIMEN:  Gallbladder.  ESTIMATED BLOOD LOSS:  Minimal.  FINDINGS:  The critical view was obtained.  The cholangiogram demonstrated prompt opacification of the cystic duct, common hepatic, left and right hepatic ducts, and common bile duct as well as prompt emptying into the duodenum.  There was no evidence of a filling defect.  INDICATIONS FOR PROCEDURE:  The patient is a 55 year old gentleman that developed acute onset of left-sided pain at the beginning of the month. He actually thought he was having a myocardial infarction.  He was actually admitted to the hospital and was ruled out for an MI.  He also had some vague upper abdominal pain as well.  An ultrasound was performed, which demonstrated a single gallstone.  He essentially underwent a HIDA scan, which demonstrated no evidence of acute cholecystitis; however, he did have an ejection fraction, which was abnormal of 18%.  We discussed the risks and benefits of surgery including, but not limited to, bleeding, infection, injury to surrounding structures, bile leak, common bile duct injury requiring major reconstructive bile duct surgery, blood clot formation, retained stones, need to convert to an open procedure, incisional hernia, prolonged diarrhea, as well as typical  postoperative recovery course as well as the possibility of non-ameliorating abdominal pain.  He elected to proceed to surgery.  DESCRIPTION OF PROCEDURE:  After obtaining informed consent, the patient was brought back to the operating room, placed supine on the operating room table.  General endotracheal anesthesia was established. Sequential compression devices were placed.  His abdomen was prepped and draped in usual standard surgical fashion with ChloraPrep.  A surgical time-out was performed.  He received antibiotics prior to skin incision. Local was infiltrated at the base of the umbilicus.  Next, a 1.5 cm vertical infraumbilical incision was made with an #11-blade.  The fascia was grasped and lifted anteriorly with Kochers.  The fascia was incised with an #11-blade and a pursestring suture was placed around the fascial edge and the 12-mm Hasson trocar was placed.  Pneumoperitoneum was smoothly established up to a patient pressure of 15 mmHg.  Laparoscope was advanced.  There was no evidence of injury to surrounding structures.  The patient was placed reverse Trendelenburg and rotated slightly to the left.  Three additional 5-mm trocars were placed, one in the subxiphoid and two in the right hypochondrium; all under direct visualization after local had been infiltrated.  The gallbladder was grasped and retracted towards the right shoulder.  The infundibulum was grasped and retracted out laterally.  I then incised the peritoneum  overlying the gallbladder both medially and laterally with hook electrocautery.  Then using the suction irrigator catheter, I bluntly dissected around the neck.  I identified the cystic duct as well as the cystic artery.  Each were circumferentially dissected out around with the aid of a [place].  I was able to see the liver through the window.  There were no other structures entering the gallbladder.  A clip was placed on the cystic duct as it  entered the gallbladder.  It was then partially transected with Endoshears.  A Cook cholangiogram catheter was percutaneously introduced through the abdominal wall and inserted into the cystic duct and secured with a clip.  The cholangiogram was performed with the above-mentioned results.  We reestablished pneumoperitoneum, removed the clip securing the cholangiogram catheter and discarded it.  Three clips were placed on the downside of the cystic duct on the biliary aspect and then it was transected with Endoshears.  Two clips were placed on the proximal aspect of the cystic artery and one distally as it entered the gallbladder.  It was then transected with Endoshears.  There was a posterior branch going into the gallbladder and two clips were placed on that and then it was ligated distally with hook electrocautery.  We then rolled the gallbladder up at the gallbladder fossa using hook electrocautery and it was eventually freed.  The camera was placed in the subxiphoid trocar, the endobag was advanced through the umbilical trocar, and the gallbladder was placed within the bag.  Thus the specimen bag was extracted within the abdominal cavity. Pneumoperitoneum was reestablished.  On palpation of the gallbladder to the specimen bag he had a single large gallstone.  The right upper quadrant was inspected and hemostasis was achieved in the gallbladder fossa with electrocautery.  It was then irrigated and there was no evidence of bile leak.  There was hemostasis.  I evacuated the irrigation fluid.  I then removed the Hasson trocar and tied down the previously placed pursestring suture at the umbilicus thus obliterating the fascial defect.  Upon viewing the area laparoscopically, there was a single adhesive band of omentum going from the omentum up to the umbilicus.  I elected to transect that with Endoshears ensuring that there was no bowel near this area.  I then injected local in  the preperitoneal space.  There was no air leak at the umbilicus.  Remaining trocars were removed.  Pneumoperitoneum was released.  All instrument and sponge counts were correct x2.  There were no immediate complications.  The patient tolerated the procedure well.     Mary Sella. Andrey Campanile, MD     EMW/MEDQ  D:  02/20/2011  T:  02/20/2011  Job:  [id number]  cc:   Magnus Sinning) Tenny Craw, M.D.  Electronically Signed by Gaynelle Adu M.D. on 02/28/2011 12:34:52 PM

## 2011-03-03 ENCOUNTER — Ambulatory Visit
Payer: No Typology Code available for payment source | Attending: Plastic Surgery within the Head & Neck | Admitting: Plastic Surgery within the Head & Neck

## 2011-03-03 DIAGNOSIS — R1312 Dysphagia, oropharyngeal phase: Secondary | ICD-10-CM

## 2011-03-03 DIAGNOSIS — Z5189 Encounter for other specified aftercare: Secondary | ICD-10-CM | POA: Insufficient documentation

## 2011-03-03 DIAGNOSIS — Z85819 Personal history of malignant neoplasm of unspecified site of lip, oral cavity, and pharynx: Secondary | ICD-10-CM | POA: Insufficient documentation

## 2011-03-05 ENCOUNTER — Encounter (INDEPENDENT_AMBULATORY_CARE_PROVIDER_SITE_OTHER): Payer: Self-pay | Admitting: General Surgery

## 2011-03-05 ENCOUNTER — Ambulatory Visit (INDEPENDENT_AMBULATORY_CARE_PROVIDER_SITE_OTHER): Payer: PRIVATE HEALTH INSURANCE | Admitting: General Surgery

## 2011-03-05 VITALS — BP 132/86 | HR 68 | Temp 97.1°F | Resp 18 | Ht 72.0 in | Wt 166.5 lb

## 2011-03-05 DIAGNOSIS — Z09 Encounter for follow-up examination after completed treatment for conditions other than malignant neoplasm: Secondary | ICD-10-CM

## 2011-03-05 NOTE — Progress Notes (Signed)
Chief complaint: Postop  Procedure: Status post laparoscopic cholecystectomy with interoperative cholangiogram February 20, 2011  History of Present Ilness: 55 year old Caucasian male comes in for his postoperative appointment. He states that he's been doing well. He denies any fevers, chills, nausea, or vomiting. He denies any melena or hematochezia. He denies any diarrhea. He did develop some constipation requiring 2 enemas. He is now having a bowel movement every day. He has some soreness on his right side with exertional activity. Otherwise he has no questions or concerns other than when he can resume full activities  Physical Exam: BP 132/86  Pulse 68  Temp(Src) 97.1 F (36.2 C) (Temporal)  Resp 18  Ht 6' (1.829 m)  Wt 166 lb 8 oz (75.524 kg)  BMI 22.58 kg/m2  Well-developed well-nourished Caucasian male in no apparent distress Pulmonary-lungs are clear Cardiac-regular rate and rhythm Abdomen-soft, nontender, nondistended. Well-healed trocar incisions. No signs of hernia. No signs of infection. Skin-no jaundice Pupils-no scleral icterus  Pathology: Chronic cholecystitis and cholelithiasis  Assessment and Plan: Status post laparoscopic cholecystectomy with cholangiogram  We discussed his pathology report. He appears to be doing quite well. I advised him to avoid fatty foods. I also advised him to eat a high fiber diet. I released him to full activities. I will see him on an as-needed basis

## 2011-03-05 NOTE — Patient Instructions (Signed)
Can resume full activities 

## 2011-03-17 ENCOUNTER — Emergency Department (HOSPITAL_COMMUNITY): Payer: 59

## 2011-03-17 ENCOUNTER — Other Ambulatory Visit: Payer: Self-pay

## 2011-03-17 ENCOUNTER — Encounter (HOSPITAL_COMMUNITY): Payer: Self-pay | Admitting: *Deleted

## 2011-03-17 ENCOUNTER — Observation Stay (HOSPITAL_COMMUNITY)
Admission: EM | Admit: 2011-03-17 | Discharge: 2011-03-18 | Disposition: A | Discharge status: Home / Self Care | Payer: 59 | Source: Ambulatory Visit | Attending: Internal Medicine | Admitting: Internal Medicine

## 2011-03-17 DIAGNOSIS — R569 Unspecified convulsions: Secondary | ICD-10-CM | POA: Diagnosis present

## 2011-03-17 DIAGNOSIS — G40909 Epilepsy, unspecified, not intractable, without status epilepticus: Secondary | ICD-10-CM | POA: Insufficient documentation

## 2011-03-17 DIAGNOSIS — R079 Chest pain, unspecified: Principal | ICD-10-CM | POA: Diagnosis present

## 2011-03-17 DIAGNOSIS — R61 Generalized hyperhidrosis: Secondary | ICD-10-CM | POA: Insufficient documentation

## 2011-03-17 DIAGNOSIS — K219 Gastro-esophageal reflux disease without esophagitis: Secondary | ICD-10-CM | POA: Insufficient documentation

## 2011-03-17 DIAGNOSIS — I10 Essential (primary) hypertension: Secondary | ICD-10-CM | POA: Diagnosis present

## 2011-03-17 HISTORY — DX: Epilepsy, unspecified, not intractable, without status epilepticus: G40.909

## 2011-03-17 LAB — BASIC METABOLIC PANEL
CO2: 28 mEq/L (ref 19–32)
Calcium: 9.2 mg/dL (ref 8.4–10.5)
Creatinine, Ser: 1.08 mg/dL (ref 0.50–1.35)
Glucose, Bld: 95 mg/dL (ref 70–99)

## 2011-03-17 LAB — POCT I-STAT, CHEM 8
Calcium, Ion: 1.16 mmol/L (ref 1.12–1.32)
Creatinine, Ser: 1.2 mg/dL (ref 0.50–1.35)
Glucose, Bld: 99 mg/dL (ref 70–99)
Hemoglobin: 12.9 g/dL — ABNORMAL LOW (ref 13.0–17.0)
TCO2: 28 mmol/L (ref 0–100)

## 2011-03-17 LAB — CBC
Hemoglobin: 12.5 g/dL — ABNORMAL LOW (ref 13.0–17.0)
MCH: 29.8 pg (ref 26.0–34.0)
MCHC: 33.3 g/dL (ref 30.0–36.0)
MCV: 89.5 fL (ref 78.0–100.0)
Platelets: 205 10*3/uL (ref 150–400)
RBC: 4.19 MIL/uL — ABNORMAL LOW (ref 4.22–5.81)

## 2011-03-17 MED ORDER — MORPHINE SULFATE 4 MG/ML IJ SOLN
4.0000 mg | Freq: Once | INTRAMUSCULAR | Status: AC
  Administered 2011-03-17: 4 mg via INTRAVENOUS

## 2011-03-17 MED ORDER — HYDROMORPHONE HCL PF 1 MG/ML IJ SOLN
1.0000 mg | Freq: Once | INTRAMUSCULAR | Status: AC
  Administered 2011-03-17: 1 mg via INTRAVENOUS

## 2011-03-17 MED ORDER — INDOMETHACIN 25 MG PO CAPS
50.0000 mg | ORAL_CAPSULE | Freq: Once | ORAL | Status: AC
  Administered 2011-03-17: 50 mg via ORAL
  Filled 2011-03-17: qty 2

## 2011-03-17 MED ORDER — LEVETIRACETAM 500 MG PO TABS
1000.0000 mg | ORAL_TABLET | Freq: Once | ORAL | Status: AC
  Administered 2011-03-17: 1000 mg via ORAL
  Filled 2011-03-17: qty 2

## 2011-03-17 MED ORDER — ONDANSETRON HCL 4 MG/2ML IJ SOLN
4.0000 mg | Freq: Once | INTRAMUSCULAR | Status: AC
  Administered 2011-03-17: 4 mg via INTRAVENOUS

## 2011-03-17 MED ORDER — LAMOTRIGINE ER 300 MG PO TB24
300.0000 mg | ORAL_TABLET | Freq: Once | ORAL | Status: AC
  Administered 2011-03-17: 300 mg via ORAL
  Filled 2011-03-17: qty 1

## 2011-03-17 NOTE — ED Notes (Signed)
Pt is from MD office with chest pain that started this am.  Pt sts he was driving when it started.  Pt received 4basa amd 5 ntg and pain is 2-3/10.  Pt has hx of seizures, epilepsy and htn.  Pt pale.  Ekg done at bridge on arrival for questionable st elevation

## 2011-03-17 NOTE — ED Notes (Signed)
The pt has no pain waiting for the admitting doctor to see.  Playing a game on his i-pad

## 2011-03-17 NOTE — ED Notes (Signed)
The pt has been given his seizure meds and some indocin for pain

## 2011-03-17 NOTE — Progress Notes (Signed)
*  PRELIMINARY RESULTS* Echocardiogram 2D Echocardiogram has been performed.  Clide Deutscher RDCS 03/17/2011, 9:36 PM

## 2011-03-17 NOTE — ED Notes (Signed)
The pt is requesting to take his seizure meds.  He just had a 2d echo

## 2011-03-17 NOTE — ED Notes (Signed)
The pt has had some lt upper chest pain for 2 days worse today.  The pain increases with movement coughing or on inspiration.

## 2011-03-17 NOTE — ED Provider Notes (Signed)
History     CSN: [id number] Arrival date & time: 03/17/2011  5:06 PM   None     Chief Complaint  Patient presents with  . Chest Pain    (Consider location/radiation/quality/duration/timing/severity/associated sxs/prior treatment) Patient is a 55 y.o. male presenting with chest pain. The history is provided by the patient.  Chest Pain The chest pain began 6 - 12 hours ago. Chest pain occurs constantly. The chest pain is unchanged. The pain is associated with exertion. The severity of the pain is moderate. The quality of the pain is described as aching. The pain does not radiate. Chest pain is worsened by exertion. Pertinent negatives for primary symptoms include no fever, no shortness of breath, no cough, no abdominal pain, no nausea and no vomiting. He tried nitroglycerin and aspirin for the symptoms. Risk factors include male gender and substance abuse. Past medical history comments: recent GB removal   Procedure history is negative for cardiac catheterization.     Past Medical History  Diagnosis Date  . Hypertension   . GERD (gastroesophageal reflux disease)   . Seizures     epileptic  . Epilepsy     Past Surgical History  Procedure Date  . Kidney surgery   . Ureterectomy 1964  . Laparoscopic cholecystectomy w/ cholangiography 02/20/11    No family history on file.  History  Substance Use Topics  . Smoking status: Never Smoker   . Smokeless tobacco: Never Used  . Alcohol Use: No      Review of Systems  Constitutional: Negative for fever.  Respiratory: Negative for cough and shortness of breath.   Cardiovascular: Positive for chest pain.  Gastrointestinal: Negative for nausea, vomiting, abdominal pain and diarrhea.  Neurological: Negative for headaches.  All other systems reviewed and are negative.    Allergies  Review of patient's allergies indicates no known allergies.  Home Medications   Current Outpatient Rx  Name Route Sig Dispense Refill  .  ALPRAZOLAM 0.5 MG PO TABS Oral Take 0.125 mg by mouth daily as needed. For anxiety.    Marland Kitchen LAMOTRIGINE 300 MG PO TB24 Oral Take 1 tablet by mouth 2 (two) times daily.      Marland Kitchen LEVETIRACETAM 500 MG PO TABS Oral Take 1,000 mg by mouth every 12 (twelve) hours.      Marland Kitchen MICARDIS 80 MG PO TABS      . ONE-DAILY MULTI VITAMINS PO TABS Oral Take 1 tablet by mouth daily.      Marland Kitchen OMEPRAZOLE 20 MG PO CPDR  as needed.     Marland Kitchen RABEPRAZOLE SODIUM 20 MG PO TBEC Oral Take 20 mg by mouth daily as needed. For acid reflux or a large amount of acid      BP 120/84  Pulse 89  Resp 19  SpO2 99%  Physical Exam  Nursing note and vitals reviewed. Constitutional: He is oriented to person, place, and time. He appears well-developed and well-nourished. No distress.  HENT:  Head: Normocephalic and atraumatic.  Eyes: EOM are normal. Pupils are equal, round, and reactive to light.  Cardiovascular: Normal rate, regular rhythm and normal heart sounds.  Exam reveals no gallop and no friction rub.   No murmur heard. Pulmonary/Chest: Effort normal and breath sounds normal. No respiratory distress. He exhibits no tenderness.  Abdominal: Soft. He exhibits no distension. There is no tenderness.  Musculoskeletal: Normal range of motion.  Neurological: He is alert and oriented to person, place, and time.  Skin: Skin is warm. He is diaphoretic.  Psychiatric: He has a normal mood and affect.    ED Course  Procedures (including critical care time)  Dg Chest 2 View  03/17/2011  *RADIOLOGY REPORT*  Clinical Data: Chest pain with shortness of breath.  CHEST - 2 VIEW  Comparison: 01/30/2011 radiographs.  Findings: The heart size and mediastinal contours are normal. The lungs are clear. There is no pleural effusion or pneumothorax. No acute osseous findings are identified. Thoracic spine degenerative changes are stable.  Cholecystectomy clips are noted.  There are few scattered air fluid levels within small bowel in the upper abdomen.  The  bowel does not appear significantly distended.  IMPRESSION: No active cardiopulmonary process. Scattered air fluid levels within small bowel in the upper abdomen.  Original Report Authenticated By: Gerrianne Scale, M.D.   Results for orders placed during the hospital encounter of 03/17/11  CBC      Component Value Range   WBC 9.9  4.0 - 10.5 (K/uL)   RBC 4.19 (*) 4.22 - 5.81 (MIL/uL)   Hemoglobin 12.5 (*) 13.0 - 17.0 (g/dL)   HCT 40.9 (*) 81.1 - 52.0 (%)   MCV 89.5  78.0 - 100.0 (fL)   MCH 29.8  26.0 - 34.0 (pg)   MCHC 33.3  30.0 - 36.0 (g/dL)   RDW 91.4  78.2 - 95.6 (%)   Platelets 205  150 - 400 (K/uL)  BASIC METABOLIC PANEL      Component Value Range   Sodium 138  135 - 145 (mEq/L)   Potassium 4.1  3.5 - 5.1 (mEq/L)   Chloride 102  96 - 112 (mEq/L)   CO2 28  19 - 32 (mEq/L)   Glucose, Bld 95  70 - 99 (mg/dL)   BUN 15  6 - 23 (mg/dL)   Creatinine, Ser 2.13  0.50 - 1.35 (mg/dL)   Calcium 9.2  8.4 - 08.6 (mg/dL)   GFR calc non Af Amer 77 (*) >90 (mL/min)   GFR calc Af Amer 89 (*) >90 (mL/min)  POCT I-STAT, CHEM 8      Component Value Range   Sodium 140  135 - 145 (mEq/L)   Potassium 4.0  3.5 - 5.1 (mEq/L)   Chloride 101  96 - 112 (mEq/L)   BUN 16  6 - 23 (mg/dL)   Creatinine, Ser 5.78  0.50 - 1.35 (mg/dL)   Glucose, Bld 99  70 - 99 (mg/dL)   Calcium, Ion 4.69  6.29 - 1.32 (mmol/L)   TCO2 28  0 - 100 (mmol/L)   Hemoglobin 12.9 (*) 13.0 - 17.0 (g/dL)   HCT 52.8 (*) 41.3 - 52.0 (%)  POCT I-STAT TROPONIN I      Component Value Range   Troponin i, poc 0.00  0.00 - 0.08 (ng/mL)   Comment 3               1. Chest pain       MDM  5:17 PM Pt seen and examined. Pt with left aching chest pain that started shortly after he woke up this morning. It is worse with excertion. Initial EKG appears to show diffuse PR depression, so CODE STEMI not activated. Will get Istat troponin and if this is elevated, will activate at that time. Concern for calcium imbalance or possible  pericarditis. Will also get CXR to look for enlarged pericardial size.   8:40 PM PT getting echo to look for pericardial disease. Pt will then need to be admitted for ACS r/o to medicine as  he does not have a cardiac doctor.  10:57 PM After speaking to admitting team, they would like to start him on treatment for pericarditis as this is high on the list of things that could be causing the patient's pain. They will bring him in for ACS r/o.  Daleen Bo 03/17/11 2359

## 2011-03-18 ENCOUNTER — Encounter (HOSPITAL_COMMUNITY): Payer: Self-pay | Admitting: Internal Medicine

## 2011-03-18 DIAGNOSIS — R079 Chest pain, unspecified: Secondary | ICD-10-CM | POA: Diagnosis present

## 2011-03-18 DIAGNOSIS — I10 Essential (primary) hypertension: Secondary | ICD-10-CM | POA: Diagnosis present

## 2011-03-18 DIAGNOSIS — R569 Unspecified convulsions: Secondary | ICD-10-CM | POA: Diagnosis present

## 2011-03-18 LAB — CARDIAC PANEL(CRET KIN+CKTOT+MB+TROPI)
CK, MB: 1.6 ng/mL (ref 0.3–4.0)
Relative Index: INVALID (ref 0.0–2.5)
Relative Index: INVALID (ref 0.0–2.5)
Relative Index: INVALID (ref 0.0–2.5)
Total CK: 68 U/L (ref 7–232)
Total CK: 55 U/L (ref 7–232)
Total CK: 56 U/L (ref 7–232)
Troponin I: <0.3 ng/mL (ref <0.30)

## 2011-03-18 LAB — BASIC METABOLIC PANEL
Calcium: 9.1 mg/dL (ref 8.4–10.5)
GFR calc Af Amer: >90 mL/min (ref >90)
GFR calc non Af Amer: >90 mL/min (ref >90)
Glucose, Bld: 108 mg/dL — ABNORMAL HIGH (ref 70–99)
Potassium: 4.1 mEq/L (ref 3.5–5.1)
Sodium: 137 mEq/L (ref 135–145)

## 2011-03-18 LAB — CBC
Hemoglobin: 12.5 g/dL — ABNORMAL LOW (ref 13.0–17.0)
MCH: 30.1 pg (ref 26.0–34.0)
Platelets: 183 10*3/uL (ref 150–400)
RBC: 4.15 MIL/uL — ABNORMAL LOW (ref 4.22–5.81)

## 2011-03-18 LAB — SEDIMENTATION RATE: Sed Rate: 14 mm/hr (ref 0–16)

## 2011-03-18 MED ORDER — ASPIRIN EC 325 MG PO TBEC
325.0000 mg | DELAYED_RELEASE_TABLET | Freq: Every day | ORAL | Status: DC
  Administered 2011-03-18: 325 mg via ORAL
  Filled 2011-03-18: qty 1

## 2011-03-18 MED ORDER — ACETAMINOPHEN 650 MG RE SUPP: 650.0000 mg | Freq: Four times a day (QID) | RECTAL | Status: DC | PRN

## 2011-03-18 MED ORDER — ONDANSETRON HCL 4 MG PO TABS: 4.0000 mg | ORAL_TABLET | Freq: Four times a day (QID) | ORAL | Status: DC | PRN

## 2011-03-18 MED ORDER — COLCHICINE 0.6 MG PO TABS
0.6000 mg | ORAL_TABLET | Freq: Every day | ORAL | Status: DC
  Administered 2011-03-18: 0.6 mg via ORAL
  Filled 2011-03-18: qty 1

## 2011-03-18 MED ORDER — IBUPROFEN 800 MG PO TABS
800.0000 mg | ORAL_TABLET | Freq: Three times a day (TID) | ORAL | Status: DC
  Administered 2011-03-18: 800 mg via ORAL
  Filled 2011-03-18 (×2): qty 1

## 2011-03-18 MED ORDER — OMEPRAZOLE 40 MG PO CPDR: 40.0000 mg | DELAYED_RELEASE_CAPSULE | Freq: Every day | ORAL | Status: AC

## 2011-03-18 MED ORDER — HYDROMORPHONE HCL PF 1 MG/ML IJ SOLN: 0.5000 mg | INTRAMUSCULAR | Status: DC | PRN

## 2011-03-18 MED ORDER — OLMESARTAN MEDOXOMIL 40 MG PO TABS
40.0000 mg | ORAL_TABLET | Freq: Every day | ORAL | Status: DC
  Administered 2011-03-18: 40 mg via ORAL
  Filled 2011-03-18 (×3): qty 1

## 2011-03-18 MED ORDER — ONDANSETRON HCL 4 MG/2ML IJ SOLN: 4.0000 mg | Freq: Four times a day (QID) | INTRAMUSCULAR | Status: DC | PRN

## 2011-03-18 MED ORDER — ACETAMINOPHEN 325 MG PO TABS: 650.0000 mg | ORAL_TABLET | Freq: Four times a day (QID) | ORAL | Status: DC | PRN

## 2011-03-18 MED ORDER — LEVETIRACETAM 500 MG PO TABS
1000.0000 mg | ORAL_TABLET | Freq: Two times a day (BID) | ORAL | Status: DC
  Administered 2011-03-18: 1000 mg via ORAL
  Filled 2011-03-18 (×3): qty 2

## 2011-03-18 MED ORDER — LAMOTRIGINE ER 300 MG PO TB24: 1.0000 | ORAL_TABLET | Freq: Two times a day (BID) | ORAL | Status: DC

## 2011-03-18 MED ORDER — SODIUM CHLORIDE 0.9 % IV SOLN
INTRAVENOUS | Status: DC
  Administered 2011-03-18: 02:00:00 via INTRAVENOUS

## 2011-03-18 MED ORDER — LAMOTRIGINE ER 300 MG PO TB24
300.0000 mg | ORAL_TABLET | Freq: Two times a day (BID) | ORAL | Status: DC
Start: 2011-03-18 — End: 2011-03-18
  Administered 2011-03-18: 300 mg via ORAL
  Filled 2011-03-18 (×2): qty 1

## 2011-03-18 MED ORDER — IBUPROFEN 800 MG PO TABS: 800.0000 mg | ORAL_TABLET | Freq: Three times a day (TID) | ORAL | Status: AC

## 2011-03-18 MED ORDER — THERA M PLUS PO TABS
1.0000 | ORAL_TABLET | Freq: Every day | ORAL | Status: DC
  Filled 2011-03-18: qty 1

## 2011-03-18 MED ORDER — HYDROMORPHONE HCL PF 1 MG/ML IJ SOLN
0.5000 mg | Freq: Once | INTRAMUSCULAR | Status: AC
  Administered 2011-03-18: 0.5 mg via INTRAVENOUS

## 2011-03-18 MED ORDER — HYDROMORPHONE HCL PF 1 MG/ML IJ SOLN
INTRAMUSCULAR | Status: AC
  Administered 2011-03-18: 0.6 mg
  Filled 2011-03-18: qty 1

## 2011-03-18 MED ORDER — COLCHICINE 0.6 MG PO TABS: 0.6000 mg | ORAL_TABLET | Freq: Every day | ORAL | Status: DC

## 2011-03-18 NOTE — ED Notes (Signed)
The pt is becoming angry that he is not in a room.

## 2011-03-18 NOTE — Consult Note (Signed)
CARDIOLOGY CONSULT NOTE  Patient ID: Timothy Middleton MRN: [id number] DOB/AGE: Oct 19, 1955 55 y.o.  Admit date: 03/17/2011 Referring Physician   Dr Loreta Ave  Primary Physician   Dr. Tenny Craw Primary Cardiologist   None  Reason for Consultation   CP, possible pericarditis  Timothy Middleton is a 55 y.o. male without a history of CAD.   Timothy Middleton was in his usual state of health yesterday until about 8:15 AM. Timothy Middleton was driving and had sudden onset of upper left chest pain. Timothy Middleton was aching. It radiated down his left arm. Timothy Middleton was a 5/10 at rest and with deep inspiration or cough it reached a 7/10. The symptoms continued all day. Timothy Middleton had 2 short episodes of diaphoresis. Timothy Middleton does not feel the pain made him short of breath, it just hurt to take a deep breath. Timothy Middleton had no nausea or vomiting. When his symptoms did not improve, Timothy Middleton  his family physician noted that his EKG was abnormal and his pain was ongoing. Timothy Middleton was transported by EMS to the emergency room. In route Timothy Middleton was given aspirin and sublingual nitroglycerin. The nitroglycerin did not change his pain at all. In the emergency room Timothy Middleton had some nausea which was treated with Zofran. The pain was treated with morphine which helped a little bit and with Dilaudid which helped a lot. Timothy Middleton also had Indocin which helped his symptoms. His symptoms have significantly improved. Because his EKG was abnormal and Timothy Middleton had chest pain, cardiology was asked to evaluate him. The working diagnosis is pericarditis.    Past Medical History  Diagnosis Date  . Hypertension   . GERD (gastroesophageal reflux disease)   . Seizures     epileptic  . Epilepsy     Past Surgical History  Procedure Date  . Kidney surgery   . Ureterectomy 1964  . Laparoscopic cholecystectomy w/ cholangiography 02/20/11    No Known Allergies  History   Social History  . Marital Status: Married    Spouse Name: N/A    Number of Children: N/A  . Years of Education: N/A   Occupational History  . Not on  file.   Social History Main Topics  . Smoking status: Never Smoker   . Smokeless tobacco: Never Used  . Alcohol Use: No  . Drug Use: Yes    Special: Marijuana     2 years  . Sexually Active: Yes   Other Topics Concern  . Not on file   Social History Narrative  . No narrative on file    Family History  Problem Relation Age of Onset  . Stroke Mother   . Stroke Father   . Hypertension Brother     Timothy Middleton had some upper right-sided chest pain after exercise and but this resolved with rest. Timothy Middleton felt that it was musculoskeletal in nature and did not take any medications for it or seek help. Since his gallbladder surgery in October Timothy Middleton had a slight head cold but did not require antibiotic therapy or anything other than over-the-counter medications for it. This resolved. Timothy Middleton feels that Timothy Middleton is recovered from his surgery very well. Timothy Middleton is currently having no GI issues. Timothy Middleton has had no fevers or chills. Timothy Middleton has no history of exertional chest pain or exertional dyspnea on exertion. Timothy Middleton exercises on a semi-regular basis, strenuous at times without symptoms. Timothy Middleton has lost about 20 pounds over the last 6 months to a year by decreasing the amount of sugars and his diet. Timothy Middleton feels that his  general health is good. Timothy Middleton T. the a fair amount of fat and saturated fat but does not recall any recent cholesterol checks. His cholesterol may have been high in the past. Full 14 point review of systems complete and found to be negative unless listed  above  Physical Exam: Blood pressure 109/70, pulse 66, temperature 98.2 F (36.8 C), temperature source Oral, resp. rate 20, height 6' (1.829 m), weight 165 lb 12.8 oz (75.206 kg), SpO2 97.00%.   General: Well developed, well nourished, in no acute distress Head: Eyes PERRLA, No xanthomas.   Normocephalic and atraumatic, oropharynx without edema or exudate.  Lungs: Clear bilaterally to auscultation and percussion. Heart: HRRR S1 S2, without M/G, no Rub noted. Pulses are 2+ & equal  all 4 extrem.   Neck:  No carotid bruit. No JVD. No lymphadenopathy Abdomen: Bowel sounds present, abdomen soft and non-tender without masses or hernias noted. Msk:  No spine or cva tenderness. Normal strength and tone for age, no joint deformities or effusions. Extremities: No clubbing, cyanosis or edema.  Neuro: Alert and oriented X 3. No focal deficits noted. Psych:  Good affect, responds appropriately Skin :  No rashes or lesions noted.  Labs: Sodium  Date/Time Value Range Status  03/18/2011  1:47 AM 137  135-145 (mEq/L) Final     Potassium  Date/Time Value Range Status  03/18/2011  1:47 AM 4.1  3.5-5.1 (mEq/L) Final     Chloride  Date/Time Value Range Status  03/18/2011  1:47 AM 100  96-112 (mEq/L) Final     CO2  Date/Time Value Range Status  03/18/2011  1:47 AM 28  19-32 (mEq/L) Final     Glucose, Bld  Date/Time Value Range Status  03/18/2011  1:47 AM 108* 70-99 (mg/dL) Final     BUN  Date/Time Value Range Status  03/18/2011  1:47 AM 13  6-23 (mg/dL) Final     Creatinine, Ser  Date/Time Value Range Status  03/18/2011  1:47 AM 0.86  0.50-1.35 (mg/dL) Final     Calcium  Date/Time Value Range Status  03/18/2011  1:47 AM 9.1  8.4-10.5 (mg/dL) Final   AST  Date/Time Value Range Status  02/17/2011  8:37 AM 20  0-37 (U/L) Final     ALT  Date/Time Value Range Status  02/17/2011  8:37 AM 23  0-53 (U/L) Final     Alkaline Phosphatase  Date/Time Value Range Status  02/17/2011  8:37 AM 77  39-117 (U/L) Final     Total Bilirubin  Date/Time Value Range Status  02/17/2011  8:37 AM 1.1  0.3-1.2 (mg/dL) Final     Total Protein  Date/Time Value Range Status  02/17/2011  8:37 AM 6.6  6.0-8.3 (g/dL) Final     Albumin  Date/Time Value Range Status  02/17/2011  8:37 AM 4.2  3.5-5.2 (g/dL) Final   Lipase  Date/Time Value Range Status  01/31/2011 11:16 AM 51  11-59 (U/L) Final   WBC  Date/Time Value Range Status  03/18/2011  1:47 AM 6.5  4.0-10.5  (K/uL) Final     Neutro Abs  Date/Time Value Range Status  02/17/2011  8:37 AM 8.7* 1.7-7.7 (K/uL) Final     Hemoglobin  Date/Time Value Range Status  03/18/2011  1:47 AM 12.5* 13.0-17.0 (g/dL) Final     HCT  Date/Time Value Range Status  03/18/2011  1:47 AM 37.0* 39.0-52.0 (%) Final     MCV  Date/Time Value Range Status  03/18/2011  1:47 AM 89.2  78.0-100.0 (fL) Final  Platelets  Date/Time Value Range Status  03/18/2011  1:47 AM 183  150-400 (K/uL) Final   Total CK  Date/Time Value Range Status  03/18/2011  1:47 AM 68  7-232 (U/L) Final     CK, MB  Date/Time Value Range Status  03/18/2011  1:47 AM 1.6  0.3-4.0 (ng/mL) Final     Troponin I  Date/Time Value Range Status  03/18/2011  1:47 AM <0.30  <0.30 (ng/mL) Final    No results found for this basename: VitaminB12, Folate, Ferritin, TIBC, Iron, reticctpct   Lab Results  Component Value Date   CHOL 138 02/01/2011   HDL 37* 02/01/2011   LDLCALC 84 02/01/2011   TRIG 84 02/01/2011   CHOLHDL 3.7 02/01/2011     Radiology: 2view CXR  IMPRESSION: No active cardiopulmonary process. Scattered air fluid levels within small bowel in the upper abdomen.  2D echo:- Left ventricle: The cavity size was normal. There was mild concentric hypertrophy. Systolic function was normal. The estimated ejection fraction was in the range of 55% to 60%. Wall motion was normal; there were no regional wall motion abnormalities. - Mitral valve: Mild regurgitation. - Atrial septum: No defect or patent foramen ovale was identified. - Pericardium, extracardiac: A small, free-flowing pericardial effusion was identified circumferential to the heart. Features were not consistent with tamponade physiology.  EKG:   Normal sinus rhythm - diffuse ST elevation consider pericarditis, inferolateral injury or acute infarct - different from 01-2011 ECG.  ASSESSMENT AND PLAN:   The patient was seen today by Marca Ancona MD, the patient  evaluated and the data reviewed. Timothy Middleton has atypical chest pain and negative cardiac enzymes. His symptoms are consistent with an inflammatory cause such as Pericarditis. Timothy Middleton has responded well to anti-inflammatories. His echocardiogram did not show any left ventricular dysfunction or wall motion abnormalities. Consideration can be given to starting an anti-inflammatory such as Motrin or Indocin with outpatient followup and possible stress testing, once the acute event has resolved. If cardiac enzymes remain negative and his symptoms are well controlled, no further inpatient workup is indicated at this time. The patient was concerned because Timothy Middleton had a seizure when Timothy Middleton was taking generic Lamictal. Therefore, I will order that the patient be allowed to take his own prescription which is brand-name. Signed: Theodore Demark 03/18/2011, 9:40 AM Co-Sign MD  Patient seen with PA, agree with above note.    Patient developed chest pain yesterday that was clearly pleuritic.  Pain resolved completely with anti-inflammatories.  ECG showed diffuse ST elevation and PR depression consistent with acute pericarditis.  Timothy Middleton had a cholecystectomy about a month ago but has not been sedentary (very active).  Doubt PE.  On exam, Timothy Middleton does not have a pericardial friction rub but has had NSAIDs already.  Echo showed normal LV and RV EF with a small free-flowing pericardial effusion.  Timothy Middleton does not have cold or URI symptoms currently.   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  - Ibuprofen 800 mg tid x 1 week then 400 mg tid x an additional week.  - Colchicine 0.6 mg daily x 3 months to decrease risk of recurrence.  - Repeat echo in 7-10 days with followup in our office (to make sure that effusion is resolving).  - check TSH, ANA, ESR today.   - Should be ok for d/c home later today.   Marca Ancona 03/18/2011 11:48 AM

## 2011-03-18 NOTE — Progress Notes (Signed)
Utilization review completed. Timothy Middleton 03/18/2011 

## 2011-03-18 NOTE — ED Provider Notes (Signed)
I saw and evaluated the patient, reviewed the resident's note and I agree with the findings and plan.   .Face to face Exam:  General:  Awake HEENT:  Atraumatic Resp:  Normal effort Abd:  Nondistended Neuro:No focal weakness Lymph: No adenopathy   Nelia Shi, MD 03/18/11 [phone]

## 2011-03-18 NOTE — Progress Notes (Signed)
Please let patient know he has an appointment for an echocardiogram and follow up with Dr Shirlee Latch on 03-30-2011. Echo at 10:30am, appt with Dr Shirlee Latch at 11:30. The number [phone]. [facility] address is [address] [place].

## 2011-03-18 NOTE — H&P (Signed)
Timothy Middleton is an 55 y.o. male.   Chief Complaint: Chest pain HPI: 55 year old male with history of seizure, hypertension has been experiencing chest pain since morning 8.15 am while driving. He did go to his PCP who referred him to ER. The chest pain is retrosternal non radiating pleuritic in nature. Denies any shortness of breath but did have diaphoresis. In the ER cardiac enzymes were negative and EKG shows PR depression concerning for pericarditis. Dr Armanda Magic did 2D Echo which shows normal LV function. Does show mild pericardial fluid. At this patient will be admitted for ACS rule out and most likely diagnosis is pericarditis. Patient did have upper respiratory tract infection two weeks ago.  Past Medical History  Diagnosis Date  . Hypertension   . GERD (gastroesophageal reflux disease)   . Seizures     epileptic  . Epilepsy     Past Surgical History  Procedure Date  . Kidney surgery   . Ureterectomy 1964  . Laparoscopic cholecystectomy w/ cholangiography 02/20/11    Family History  Problem Relation Age of Onset  . Stroke Mother   . Stroke Father   . Hypertension Brother    Social History:  reports that he has never smoked. He has never used smokeless tobacco. He reports that he uses illicit drugs (Marijuana). He reports that he does not drink alcohol.  Allergies: No Known Allergies  Medications Prior to Admission  Medication Dose Route Frequency Provider Last Rate Last Dose  . HYDROmorphone (DILAUDID) injection 0.5 mg  0.5 mg Intravenous Once Eduard Clos      . HYDROmorphone (DILAUDID) injection 1 mg  1 mg Intravenous Once Daleen Bo   1 mg at 03/17/11 2013  . indomethacin (INDOCIN) capsule 50 mg  50 mg Oral Once Daleen Bo   50 mg at 03/17/11 2246  . LamoTRIgine TB24 300 mg  300 mg Oral Once Nelia Shi, MD   300 mg at 03/17/11 2250  . levETIRAcetam (KEPPRA) tablet 1,000 mg  1,000 mg Oral Once Daleen Bo   1,000 mg at 03/17/11 2238  . morphine  4 MG/ML injection 4 mg  4 mg Intravenous Once Daleen Bo   4 mg at 03/17/11 1827  . ondansetron (ZOFRAN) injection 4 mg  4 mg Intravenous Once Daleen Bo   4 mg at 03/17/11 2008   Medications Prior to Admission  Medication Sig Dispense Refill  . LamoTRIgine (LAMICTAL XR) 300 MG TB24 Take 1 tablet by mouth 2 (two) times daily.        Marland Kitchen levETIRAcetam (KEPPRA) 500 MG tablet Take 1,000 mg by mouth every 12 (twelve) hours.        Marland Kitchen MICARDIS 80 MG tablet Take 80 mg by mouth every morning.       . Multiple Vitamin (MULTIVITAMIN) tablet Take 1 tablet by mouth daily.          Results for orders placed during the hospital encounter of 03/17/11 (from the past 48 hour(s))  CBC     Status: Abnormal   Collection Time   03/17/11  5:15 PM      Component Value Range Comment   WBC 9.9  4.0 - 10.5 (K/uL)    RBC 4.19 (*) 4.22 - 5.81 (MIL/uL)    Hemoglobin 12.5 (*) 13.0 - 17.0 (g/dL)    HCT 46.9 (*) 62.9 - 52.0 (%)    MCV 89.5  78.0 - 100.0 (fL)    MCH 29.8  26.0 - 34.0 (pg)    MCHC 33.3  30.0 - 36.0 (g/dL)    RDW 40.9  81.1 - 91.4 (%)    Platelets 205  150 - 400 (K/uL)   BASIC METABOLIC PANEL     Status: Abnormal   Collection Time   03/17/11  5:15 PM      Component Value Range Comment   Sodium 138  135 - 145 (mEq/L)    Potassium 4.1  3.5 - 5.1 (mEq/L)    Chloride 102  96 - 112 (mEq/L)    CO2 28  19 - 32 (mEq/L)    Glucose, Bld 95  70 - 99 (mg/dL)    BUN 15  6 - 23 (mg/dL)    Creatinine, Ser 7.82  0.50 - 1.35 (mg/dL)    Calcium 9.2  8.4 - 10.5 (mg/dL)    GFR calc non Af Amer 77 (*) >90 (mL/min)    GFR calc Af Amer 89 (*) >90 (mL/min)   POCT I-STAT, CHEM 8     Status: Abnormal   Collection Time   03/17/11  5:26 PM      Component Value Range Comment   Sodium 140  135 - 145 (mEq/L)    Potassium 4.0  3.5 - 5.1 (mEq/L)    Chloride 101  96 - 112 (mEq/L)    BUN 16  6 - 23 (mg/dL)    Creatinine, Ser 9.56  0.50 - 1.35 (mg/dL)    Glucose, Bld 99  70 - 99 (mg/dL)    Calcium, Ion 2.13  1.12 -  1.32 (mmol/L)    TCO2 28  0 - 100 (mmol/L)    Hemoglobin 12.9 (*) 13.0 - 17.0 (g/dL)    HCT 08.6 (*) 57.8 - 52.0 (%)   POCT I-STAT TROPONIN I     Status: Normal   Collection Time   03/17/11  5:51 PM      Component Value Range Comment   Troponin i, poc 0.00  0.00 - 0.08 (ng/mL)    Comment 3             Dg Chest 2 View  03/17/2011  *RADIOLOGY REPORT*  Clinical Data: Chest pain with shortness of breath.  CHEST - 2 VIEW  Comparison: 01/30/2011 radiographs.  Findings: The heart size and mediastinal contours are normal. The lungs are clear. There is no pleural effusion or pneumothorax. No acute osseous findings are identified. Thoracic spine degenerative changes are stable.  Cholecystectomy clips are noted.  There are few scattered air fluid levels within small bowel in the upper abdomen.  The bowel does not appear significantly distended.  IMPRESSION: No active cardiopulmonary process. Scattered air fluid levels within small bowel in the upper abdomen.  Original Report Authenticated By: Gerrianne Scale, M.D.    Review of Systems  Constitutional: Positive for diaphoresis.  HENT: Negative.   Eyes: Negative.   Respiratory: Negative.   Cardiovascular: Positive for chest pain.  Gastrointestinal: Negative.   Genitourinary: Negative.   Musculoskeletal: Negative.   Skin: Negative.   Neurological: Negative.   Endo/Heme/Allergies: Negative.   Psychiatric/Behavioral: Negative.     Blood pressure 105/78, pulse 77, resp. rate 16, SpO2 98.00%. Physical Exam  Constitutional: He is oriented to person, place, and time. He appears well-developed and well-nourished.  HENT:  Head: Normocephalic and atraumatic.  Right Ear: External ear normal.  Left Ear: External ear normal.  Nose: Nose normal.  Mouth/Throat: Oropharynx is clear and moist.  Eyes: Conjunctivae and EOM are normal. Pupils  are equal, round, and reactive to light.  Neck: Normal range of motion. Neck supple.  Cardiovascular: Normal rate,  regular rhythm, normal heart sounds and intact distal pulses.   Respiratory: Effort normal and breath sounds normal.  GI: Soft. Bowel sounds are normal.  Musculoskeletal: Normal range of motion.  Neurological: He is alert and oriented to person, place, and time. He has normal reflexes.  Skin: Skin is warm and dry.  Psychiatric: His behavior is normal.     Assessment/Plan 1)Chest pain pleuritic concerning for percarditis 2)H/O Seizure 3)H/O Hypertension  Plan:  Admit to telemetry Will cycle cardiac markers and rule out ACS. If ACS is ruled out patient will probably need NSAID'S.  Terianna Peggs N. 03/18/2011, 1:08 AM

## 2011-03-18 NOTE — Progress Notes (Signed)
03/18/2011 Timothy Middleton Case Management Note [phone]         54yo male patient admitted 03/17/11 with c/o chest pain. History significant for HTN, Gerd, and seizures. In to speak with patient. Prior to admission, patient lived at home with spouse. Independent with ADLs. Anticipated discharge plan will be to return to home with self care. No anticipated home health/discharge needs. CM will continue to follow

## 2011-03-18 NOTE — Progress Notes (Signed)
Pt and spouse given discharge instructions and questions/concerns answered and addressed. No personal belongings left in room. VSS. IV discontinued and site unremarkable. Will transport patient to personal vehicle via wheelchair. Debbora Presto 03/18/2011 6:59 PM

## 2011-03-18 NOTE — ED Notes (Signed)
Admitting doctor in to see 

## 2011-03-18 NOTE — Progress Notes (Signed)
Subjective: No further chest pain. Anxious to go home.  Objective: Vital signs in last 24 hours: Temp:  [98.2 F (36.8 C)] 98.2 F (36.8 C) (11/21 0615) Pulse Rate:  [66-89] 66  (11/21 0615) Resp:  [12-20] 20  (11/21 0615) BP: (100-150)/(69-87) 109/70 mmHg (11/21 0615) SpO2:  [96 %-99 %] 97 % (11/21 0615) Weight:  [75.206 kg (165 lb 12.8 oz)] 165 lb 12.8 oz (75.206 kg) (11/21 0134) Weight change:  Last BM Date: 03/15/11  Intake/Output from previous day:       Physical Exam: General: Alert, awake, oriented x3, in no acute distress. HEENT: No bruits, no goiter. Heart: Regular rate and rhythm, without murmurs, rubs, gallops. Lungs: Clear to auscultation bilaterally. Abdomen: Soft, nontender, nondistended, positive bowel sounds. Extremities: No clubbing cyanosis or edema with positive pedal pulses. Neuro: Grossly intact, nonfocal.    Lab Results: Basic Metabolic Panel:  Basename 03/18/11 0147 03/17/11 1726 03/17/11 1715  NA 137 140 --  K 4.1 4.0 --  CL 100 101 --  CO2 28 -- 28  GLUCOSE 108* 99 --  BUN 13 16 --  CREATININE 0.86 1.20 --  CALCIUM 9.1 -- 9.2  MG -- -- --  PHOS -- -- --   CBC:  Basename 03/18/11 0147 03/17/11 1726 03/17/11 1715  WBC 6.5 -- 9.9  NEUTROABS -- -- --  HGB 12.5* 12.9* --  HCT 37.0* 38.0* --  MCV 89.2 -- 89.5  PLT 183 -- 205   Cardiac Enzymes:  Basename 03/18/11 0147  CKTOTAL 68  CKMB 1.6  CKMBINDEX --  TROPONINI <0.30    Studies/Results: Dg Chest 2 View  03/17/2011  *RADIOLOGY REPORT*  Clinical Data: Chest pain with shortness of breath.  CHEST - 2 VIEW  Comparison: 01/30/2011 radiographs.  Findings: The heart size and mediastinal contours are normal. The lungs are clear. There is no pleural effusion or pneumothorax. No acute osseous findings are identified. Thoracic spine degenerative changes are stable.  Cholecystectomy clips are noted.  There are few scattered air fluid levels within small bowel in the upper abdomen.  The  bowel does not appear significantly distended.  IMPRESSION: No active cardiopulmonary process. Scattered air fluid levels within small bowel in the upper abdomen.  Original Report Authenticated By: Gerrianne Scale, M.D.    Medications: Scheduled Meds:   . aspirin EC  325 mg Oral Daily  . HYDROmorphone      .  HYDROmorphone (DILAUDID) injection  0.5 mg Intravenous Once  .  HYDROmorphone (DILAUDID) injection  1 mg Intravenous Once  . indomethacin  50 mg Oral Once  . LamoTRIgine  300 mg Oral Once  . LamoTRIgine  300 mg Oral BID  . levETIRAcetam  1,000 mg Oral Once  . levETIRAcetam  1,000 mg Oral Q12H  .  morphine injection  4 mg Intravenous Once  . multivitamins ther. w/minerals  1 tablet Oral Daily  . olmesartan  40 mg Oral Daily  . ondansetron (ZOFRAN) IV  4 mg Intravenous Once  . DISCONTD: LamoTRIgine  1 tablet Oral BID   Continuous Infusions:   . sodium chloride 50 mL/hr at 03/18/11 0203   PRN Meds:.acetaminophen, acetaminophen, HYDROmorphone, ondansetron (ZOFRAN) IV, ondansetron  Assessment/Plan:  Principal Problem:  *Chest pain Active Problems:  Seizure  Hypertension  #1 chest pain: Only coronary artery disease risk factor is hypertension. So far one set of cardiac enzymes is negative. EKG on admission shows less than 1 mm ST elevation in the anterolateral leads. There was initially some concern  for pericarditis. Cardiology was consulted and a bedside 2-D echo was performed by Dr. Armanda Magic that showed a normal ejection fraction, no signs of diastolic dysfunction and only a small/trivial pericardial effusion with no signs for tamponade. Continue aspirin.  #2 seizure disorder: Continue Lamictal. No active seizures while in hospital.  #3 hypertension: Well controlled.  #4 disposition: Plan for discharge home later this afternoon versus tomorrow once acute coronary syndrome workup is complete.   LOS: 1 day   HERNANDEZ ACOSTA,ESTELA 03/18/2011, 8:29 AM

## 2011-03-19 NOTE — Discharge Summary (Signed)
Physician Discharge Summary  Patient ID: Timothy Middleton MRN: [id number] DOB/AGE: 07-07-1955 55 y.o.  Admit date: 03/17/2011 Discharge date: 03/19/2011  Primary Care Physician:  Daisy Floro, MD   Discharge Diagnoses:    Principal Problem:  *Chest pain ruled out for acute coronary syndrome, believe secondary to pericarditis. Active Problems:  Seizure  Hypertension    Discharge Medication List as of 03/18/2011  6:37 PM    START taking these medications   Details  colchicine 0.6 MG tablet Take 1 tablet (0.6 mg total) by mouth daily., Starting 03/18/2011, Until Thu 03/17/12, Print    ibuprofen (ADVIL,MOTRIN) 800 MG tablet Take 1 tablet (800 mg total) by mouth 3 (three) times daily., Starting 03/18/2011, Until Sat 03/28/11, Print    omeprazole (PRILOSEC) 40 MG capsule Take 1 capsule (40 mg total) by mouth daily., Starting 03/18/2011, Until Thu 03/17/12, Print      CONTINUE these medications which have NOT CHANGED   Details  LamoTRIgine (LAMICTAL XR) 300 MG TB24 Take 1 tablet by mouth 2 (two) times daily.  , Until Discontinued, Historical Med    levETIRAcetam (KEPPRA) 500 MG tablet Take 1,000 mg by mouth every 12 (twelve) hours.  , Until Discontinued, Historical Med    MICARDIS 80 MG tablet Take 80 mg by mouth every morning. , Starting 02/06/2011, Until Discontinued, Historical Med    Multiple Vitamin (MULTIVITAMIN) tablet Take 1 tablet by mouth daily.  , Until Discontinued, Historical Med         Disposition and Follow-up:  Patient will be discharged home today in stable and improved condition. Echocardiogram and follow up with Dr Shirlee Latch on 03-30-2011.  Echo at 10:30am, appt with Dr Shirlee Latch at 11:30.    Consults:  cardiology Dr. Shirlee Latch   Significant Diagnostic Studies:  Dg Chest 2 View  03/17/2011  *RADIOLOGY REPORT*  Clinical Data: Chest pain with shortness of breath.  CHEST - 2 VIEW  Comparison: 01/30/2011 radiographs.  Findings: The heart size and  mediastinal contours are normal. The lungs are clear. There is no pleural effusion or pneumothorax. No acute osseous findings are identified. Thoracic spine degenerative changes are stable.  Cholecystectomy clips are noted.  There are few scattered air fluid levels within small bowel in the upper abdomen.  The bowel does not appear significantly distended.  IMPRESSION: No active cardiopulmonary process. Scattered air fluid levels within small bowel in the upper abdomen.  Original Report Authenticated By: Gerrianne Scale, M.D.    Brief H and P: For complete details please refer to admission H and P, but in brief patient is a 55 year old white man with history of hypertension and seizure disorder who presented to the hospital with chest pain we are asked to admit him for further evaluation and management.    Hospital Course:  Principal Problem:  *Chest pain Active Problems:  Seizure  Hypertension  #1 chest pain: Admission EKG showed diffuse ST elevations. Dr. Armanda Magic did a bedside echo with the following result:normal ejection fraction, no signs of diastolic dysfunction and only a small/trivial pericardial effusion with no signs for tamponade.   He did rule out for acute coronary syndrome. He improved rapidly on colchicine and ibuprofen and will be discharged on these medications. He has a followup echocardiogram and appointment with Dr. Shirlee Latch on December 3.  Rest of chronic conditions have been stable this hospitalization, home medications have not been altered.  Time spent on Discharge: Greater than 30 minutes  Signed: HERNANDEZ ACOSTA,ESTELA 03/19/2011, 7:51 AM

## 2011-03-20 LAB — ANA: Anti Nuclear Antibody(ANA): NEGATIVE

## 2011-03-30 ENCOUNTER — Encounter: Payer: Self-pay | Admitting: Cardiology

## 2011-03-30 ENCOUNTER — Ambulatory Visit (HOSPITAL_COMMUNITY): Payer: 59 | Attending: Cardiology | Admitting: Radiology

## 2011-03-30 ENCOUNTER — Ambulatory Visit (INDEPENDENT_AMBULATORY_CARE_PROVIDER_SITE_OTHER): Payer: 59 | Admitting: Cardiology

## 2011-03-30 ENCOUNTER — Other Ambulatory Visit (HOSPITAL_COMMUNITY): Payer: Self-pay | Admitting: Cardiology

## 2011-03-30 DIAGNOSIS — I10 Essential (primary) hypertension: Secondary | ICD-10-CM

## 2011-03-30 DIAGNOSIS — R079 Chest pain, unspecified: Secondary | ICD-10-CM

## 2011-03-30 DIAGNOSIS — I079 Rheumatic tricuspid valve disease, unspecified: Secondary | ICD-10-CM | POA: Insufficient documentation

## 2011-03-30 DIAGNOSIS — R072 Precordial pain: Secondary | ICD-10-CM

## 2011-03-30 DIAGNOSIS — I501 Left ventricular failure: Secondary | ICD-10-CM

## 2011-03-30 DIAGNOSIS — I359 Nonrheumatic aortic valve disorder, unspecified: Secondary | ICD-10-CM | POA: Insufficient documentation

## 2011-03-30 DIAGNOSIS — I059 Rheumatic mitral valve disease, unspecified: Secondary | ICD-10-CM | POA: Insufficient documentation

## 2011-03-30 MED ORDER — COLCHICINE 0.6 MG PO TABS: 0.6000 mg | ORAL_TABLET | Freq: Every day | ORAL | Status: AC

## 2011-03-30 NOTE — Patient Instructions (Signed)
Decrease ibuprofen to 400mg  twice a day. Stop taking this on Wednesday.  Take colchicine 0.6mg  daily for a total of 90 days.  Your physician has requested that you have an exercise tolerance test. For further information please visit [email or web address] Please also follow instruction sheet, as given.  Take and record your blood pressure daily. Bring these readings when you come for the treadmill in about 2 weeks.

## 2011-03-31 NOTE — Progress Notes (Signed)
PCP: Dr. Tenny Craw  55 yo with h/o HTN presented in 11/12 to [facility] with pleuritic chest pain.  The pain started while he was driving in his car.  ECG changes were consistent with acute pericarditis.  Echo showed a small circumferential pericardial effusion.  TSH was normal.  Episode was thought to be most likely due to a viral infection.  He was started on Ibuprofen and colchicine with resolution of the pleuritic chest pain.  Of note, patient presented to [facility] prior to this in 10/12, this time also with chest pain.  This chest pain was not pleuritic.  During this admission, he was thought to have cholecystitis and had a cholecystectomy.    Since most recent discharge, he has been doing well.  No further chest pain.  He has excellent exercise tolerance and denies exertional dyspnea or chest pain.  His BP has been running high; it is 150/103 today.  He takes Micardis at home.   ECG (11/12, from hospital): Diffuse ST elevation and PR depression consistent with acute pericarditis.   Labs (11/12): K 4.1, creatinine 0.86, TSH normal, cardiac enzymes, normal, ANA negative, ESR 14  PMH: 1. Nephrolithiasis 2. Cholecystectomy (10/12) 3. HTN: Cough with ACEI 4. GERD 5. Seizure disorder 6. Acute pericarditis (11/12): Echo (11/12) with EF 55-60% and small circumferential pericardial effusion.  Echo (12/12) with EF 55-60%, trivial pericardial effusion 7. 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  Aortic valve was trileaflet.    SH: Married, nonsmoker, occasional ETOH, lives in [place]  FH: No premature CAD  ROS: All systems reviewed and negative except as per HPI  Current Outpatient Prescriptions  Medication Sig Dispense Refill  . ALPRAZolam (XANAX) 0.5 MG tablet Every other day. As needed      . colchicine 0.6 MG tablet Take 1 tablet (0.6 mg total) by mouth daily.  30 tablet  1  . LamoTRIgine (LAMICTAL XR) 300 MG TB24 Take 1 tablet by mouth 2 (two) times daily.        Marland Kitchen  levETIRAcetam (KEPPRA) 500 MG tablet Take 1,000 mg by mouth every 12 (twelve) hours.        Marland Kitchen MICARDIS 80 MG tablet Take 80 mg by mouth every morning.       . Multiple Vitamin (MULTIVITAMIN) tablet Take 1 tablet by mouth daily.        Marland Kitchen omeprazole (PRILOSEC) 40 MG capsule Take 1 capsule (40 mg total) by mouth daily.  30 capsule  0    BP 150/103  Pulse 69  Ht 6' (1.829 m)  Wt 76.204 kg (168 lb)  BMI 22.78 kg/m2 General: NAD Neck: No JVD, no thyromegaly or thyroid nodule.  Lungs: Clear to auscultation bilaterally with normal respiratory effort. CV: Nondisplaced PMI.  Heart regular S1/S2, no S3/S4, no murmur, no rub.  No peripheral edema.  No carotid bruit.  Normal pedal pulses.  Abdomen: Soft, nontender, no hepatosplenomegaly, no distention.  Skin: Intact without lesions or rashes.  Neurologic: Alert and oriented x 3.  Psych: Normal affect. Extremities: No clubbing or cyanosis.  HEENT: Normal.

## 2011-03-31 NOTE — Assessment & Plan Note (Signed)
BP running high, likely worsened by Ibuprofen.  He will cut back on Ibuprofen today and stop it on Wednesday.  I will also have him check his BP daily when off Ibuprofen.  We will call him in 2 weeks for his BP readings.  If BP is running high, will start amlodipine or chlorthalidone.   Check lipids today.

## 2011-03-31 NOTE — Assessment & Plan Note (Signed)
Patient's recent chest pain episode was consistent with acute pericarditis.  Symptoms have resolved with Ibuprofen and colchicine.  I reviewed his echo today, pericardial effusion is smaller (small effusion before, trivial effusion today).  No evidence for development of pericardial constriction.  - Decrease Ibuprofen to 400 mg bid and complete 2 weeks total of Ibuprofen treatment (will complete on Wednesday).  - Continue colchicine x 3 months to decrease risk of recurrence of pancreatitis.  - Patient is concerned about coronary disease.  I will have him do an ETT (no imaging).

## 2011-04-14 ENCOUNTER — Ambulatory Visit (INDEPENDENT_AMBULATORY_CARE_PROVIDER_SITE_OTHER): Payer: 59 | Admitting: Physician Assistant

## 2011-04-14 DIAGNOSIS — I10 Essential (primary) hypertension: Secondary | ICD-10-CM

## 2011-04-14 DIAGNOSIS — R079 Chest pain, unspecified: Secondary | ICD-10-CM

## 2011-04-14 MED ORDER — AMLODIPINE BESYLATE 5 MG PO TABS: 5.0000 mg | ORAL_TABLET | Freq: Every day | ORAL | Status: DC

## 2011-04-14 NOTE — Progress Notes (Signed)
Exercise Treadmill Test  Pre-Exercise Testing Evaluation Rhythm: normal sinus  Rate: 87   PR:  .15 QRS:  .09  QT:  .36 QTc: .43     Test  Exercise Tolerance Test Ordering MD: Marca Ancona, MD  Interpreting MD:  Tereso Newcomer PA-C  Unique Test No: 1  Treadmill:  1  Indication for ETT: chest pain - rule out ischemia  Contraindication to ETT: No   Stress Modality: exercise - treadmill  Cardiac Imaging Performed: non   Protocol: standard Bruce - maximal  Max BP:  189/95  Max MPHR (bpm):  167 85% MPR (bpm):  141  MPHR obtained (bpm):  169 % MPHR obtained:  101%  Reached 85% MPHR (min:sec):  5:10 Total Exercise Time (min-sec):  9:00  Workload in METS:  10.1 Borg Scale: 15  Reason ETT Terminated:  patient's desire to stop    ST Segment Analysis At Rest: normal ST segments - no evidence of significant ST depression With Exercise: no evidence of significant ST depression  Other Information Arrhythmia:  No Angina during ETT:  absent (0) Quality of ETT:  diagnostic  ETT Interpretation:  normal - no evidence of ischemia by ST analysis  Comments: Good exercise tolerance. No chest pain. Normal BP response to exercise. No ST-T changes to suggest ischemia.   Recommendations: BPs reviewed.  BP has remained elevated since d/c of ibuprofen. Will add amlodipine 5 mg QD.  Patient will call in with BP readings after starting Amlodipine in 2 weeks. Follow up with Dr. Marca Ancona as directed. Tereso Newcomer, PA-C  8:55 AM 04/14/2011

## 2011-09-01 ENCOUNTER — Encounter (HOSPITAL_BASED_OUTPATIENT_CLINIC_OR_DEPARTMENT_OTHER): Payer: Self-pay | Admitting: Plastic Surgery within the Head & Neck

## 2011-09-08 ENCOUNTER — Encounter (HOSPITAL_BASED_OUTPATIENT_CLINIC_OR_DEPARTMENT_OTHER): Payer: No Typology Code available for payment source | Admitting: Plastic Surgery within the Head & Neck

## 2012-04-24 ENCOUNTER — Other Ambulatory Visit: Payer: Self-pay | Admitting: Physician Assistant

## 2012-12-05 IMAGING — NM NM HEPATOBILIARY IMAGE, INC GB
3 series · 13 of 13 positions shown · non-contrast
Comparison: Abdominal ultrasound 02/01/2011.

CLINICAL DATA: Chest and epigastric pain with nausea.

NUCLEAR MEDICINE HEPATOBILIARY IMAGING WITH GALLBLADDER EF
TECHNIQUE: Sequential images of the abdomen were obtained [DATE]
minutes following intravenous administration of
radiopharmaceutical.  After the slow intravenous infusion of
uCg Cholecystokinin, the gallbladder ejection fraction was
determined.
Radiopharmaceutical:  5.0 mCi Ic-HHm Choletec

[he hepatobiliary · 3.43mm/px · 6 of 30 frames shown (1 of 3)]
[frame 3/30]
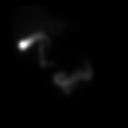
[frame 8/30]
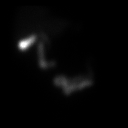
[frame 13/30]
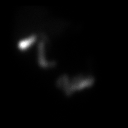
[frame 18/30]
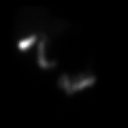
[frame 23/30]
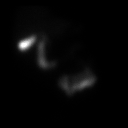
[frame 28/30]
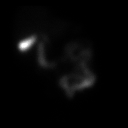

[he hepatobiliary · 3.43mm/px · 6 of 53 frames shown (2 of 3)]
[frame 5/53]
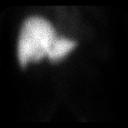
[frame 13/53]
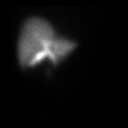
[frame 22/53]
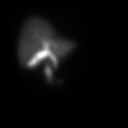
[frame 31/53]
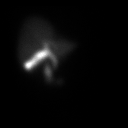
[frame 40/53]
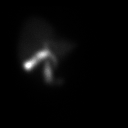
[frame 49/53]
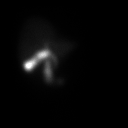

[he hepatobiliary · 1 of 1 slices shown (3 of 3)]
[im 1/1]
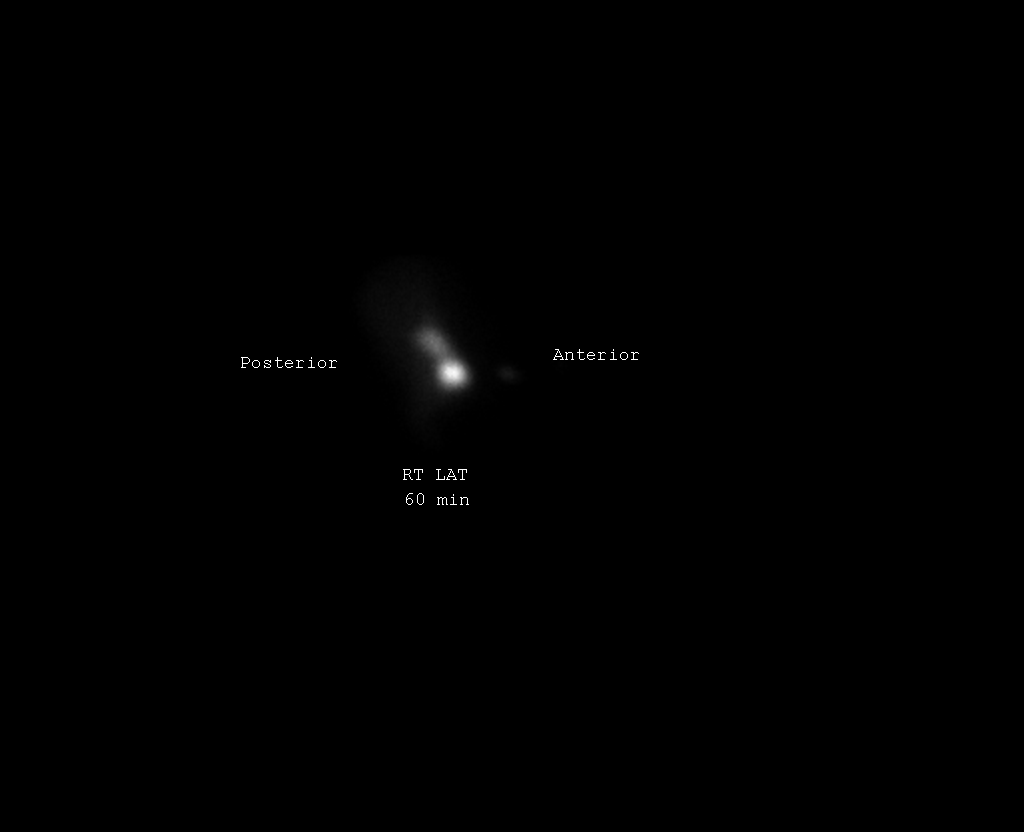

[13 of 13 positions shown; findings below may reference images not displayed]

FINDINGS: Initial images demonstrate homogenous hepatic activity
and prompt visualization of the gallbladder and biliary system.

The stimulated portion of the study demonstrates limited
gallbladder contraction.  There is progressive small bowel
activity.  The gallbladder ejection fraction calculated at
approximately 30 minutes is 17.9%.  Normal ejection fractions are
considered greater than 30%.

The patient did not experience symptoms  during CCK administration.
IMPRESSION: 1.  The cystic and common bile ducts are patent.
2.  There is a decreased gallbladder ejection fraction of 18%
suggesting biliary dyskinesia.  The patient did not have
progressive symptoms with CCK administration.

## 2012-12-26 ENCOUNTER — Other Ambulatory Visit: Payer: Self-pay | Admitting: Cardiology

## 2013-01-27 ENCOUNTER — Other Ambulatory Visit: Payer: Self-pay | Admitting: Cardiology

## 2014-04-05 ENCOUNTER — Emergency Department (HOSPITAL_COMMUNITY)
Admission: EM | Admit: 2014-04-05 | Discharge: 2014-04-05 | Disposition: A | Discharge status: Home / Self Care | Payer: BC Managed Care – PPO | Attending: Emergency Medicine | Admitting: Emergency Medicine

## 2014-04-05 ENCOUNTER — Encounter (HOSPITAL_COMMUNITY): Payer: Self-pay | Admitting: Emergency Medicine

## 2014-04-05 ENCOUNTER — Emergency Department (HOSPITAL_COMMUNITY): Payer: BC Managed Care – PPO

## 2014-04-05 DIAGNOSIS — R0789 Other chest pain: Secondary | ICD-10-CM

## 2014-04-05 DIAGNOSIS — I10 Essential (primary) hypertension: Secondary | ICD-10-CM | POA: Insufficient documentation

## 2014-04-05 DIAGNOSIS — Z79899 Other long term (current) drug therapy: Secondary | ICD-10-CM | POA: Insufficient documentation

## 2014-04-05 DIAGNOSIS — R52 Pain, unspecified: Secondary | ICD-10-CM

## 2014-04-05 DIAGNOSIS — K219 Gastro-esophageal reflux disease without esophagitis: Secondary | ICD-10-CM | POA: Insufficient documentation

## 2014-04-05 DIAGNOSIS — F419 Anxiety disorder, unspecified: Secondary | ICD-10-CM

## 2014-04-05 DIAGNOSIS — R079 Chest pain, unspecified: Secondary | ICD-10-CM

## 2014-04-05 DIAGNOSIS — G40909 Epilepsy, unspecified, not intractable, without status epilepticus: Secondary | ICD-10-CM | POA: Insufficient documentation

## 2014-04-05 DIAGNOSIS — R110 Nausea: Secondary | ICD-10-CM | POA: Diagnosis not present

## 2014-04-05 LAB — CBC WITH DIFFERENTIAL/PLATELET
BASOS ABS: 0 10*3/uL (ref 0.0–0.1)
BASOS PCT: 0 % (ref 0–1)
Eosinophils Absolute: 0.1 10*3/uL (ref 0.0–0.7)
Eosinophils Relative: 2 % (ref 0–5)
HCT: 43 % (ref 39.0–52.0)
Hemoglobin: 14.3 g/dL (ref 13.0–17.0)
Lymphocytes Relative: 23 % (ref 12–46)
Lymphs Abs: 1.5 10*3/uL (ref 0.7–4.0)
MCH: 30.2 pg (ref 26.0–34.0)
MCHC: 33.3 g/dL (ref 30.0–36.0)
MCV: 90.9 fL (ref 78.0–100.0)
MONOS PCT: 8 % (ref 3–12)
Monocytes Absolute: 0.5 10*3/uL (ref 0.1–1.0)
NEUTROS ABS: 4.4 10*3/uL (ref 1.7–7.7)
NEUTROS PCT: 67 % (ref 43–77)
Platelets: 246 10*3/uL (ref 150–400)
RBC: 4.73 MIL/uL (ref 4.22–5.81)
RDW: 12.4 % (ref 11.5–15.5)
WBC: 6.5 10*3/uL (ref 4.0–10.5)

## 2014-04-05 LAB — COMPREHENSIVE METABOLIC PANEL
ALK PHOS: 82 U/L (ref 39–117)
ALT: 17 U/L (ref 0–53)
AST: 23 U/L (ref 0–37)
Albumin: 4.3 g/dL (ref 3.5–5.2)
Anion gap: 13 (ref 5–15)
BILIRUBIN TOTAL: 0.5 mg/dL (ref 0.3–1.2)
BUN: 10 mg/dL (ref 6–23)
CHLORIDE: 102 meq/L (ref 96–112)
CO2: 27 meq/L (ref 19–32)
Calcium: 9.7 mg/dL (ref 8.4–10.5)
Creatinine, Ser: 1.07 mg/dL (ref 0.50–1.35)
GFR calc Af Amer: 88 mL/min — ABNORMAL LOW (ref >90)
GFR, EST NON AFRICAN AMERICAN: 76 mL/min — AB (ref >90)
Glucose, Bld: 106 mg/dL — ABNORMAL HIGH (ref 70–99)
POTASSIUM: 4 meq/L (ref 3.7–5.3)
Sodium: 142 mEq/L (ref 137–147)
Total Protein: 7.4 g/dL (ref 6.0–8.3)

## 2014-04-05 LAB — TROPONIN I: Troponin I: <0.3 ng/mL (ref <0.30)

## 2014-04-05 MED ORDER — ASPIRIN 81 MG PO CHEW
324.0000 mg | CHEWABLE_TABLET | Freq: Once | ORAL | Status: AC
  Administered 2014-04-05: 324 mg via ORAL
  Filled 2014-04-05: qty 4

## 2014-04-05 MED ORDER — NITROGLYCERIN 0.4 MG SL SUBL
0.4000 mg | SUBLINGUAL_TABLET | SUBLINGUAL | Status: DC | PRN
Start: 2014-04-05 — End: 2014-04-05
  Administered 2014-04-05: 0.4 mg via SUBLINGUAL
  Filled 2014-04-05: qty 1

## 2014-04-05 NOTE — ED Provider Notes (Signed)
CSN: [id number]     Arrival date & time 04/05/14  1124 History   First MD Initiated Contact with Patient 04/05/14 1128     Chief Complaint  Patient presents with  . Pleurisy     (Consider location/radiation/quality/duration/timing/severity/associated sxs/prior Treatment) Patient is a 58 y.o. male presenting with chest pain. The history is provided by the patient. No language interpreter was used.  Chest Pain Pain location:  L chest Pain quality: aching   Pain radiates to:  R jaw Pain radiates to the back: no   Pain severity:  Moderate Onset quality:  Gradual Duration:  2 weeks Timing:  Intermittent Progression:  Worsening Context: not breathing   Relieved by:  Nothing Worsened by:  Nothing tried Ineffective treatments:  None tried Associated symptoms: nausea   Associated symptoms: no cough   Risk factors: hypertension   Pt reports pain began on left side of chest 2 weeks ago.  Pt reports today pain in right side of chest that radiates to his right jaw.  Pt reports sore feelsing like he has had his jaw clenched.  Past Medical History  Diagnosis Date  . Hypertension   . GERD (gastroesophageal reflux disease)   . Seizures     epileptic  . Epilepsy    Past Surgical History  Procedure Laterality Date  . Kidney surgery    . Ureterectomy  1964  . Laparoscopic cholecystectomy w/ cholangiography  02/20/11   Family History  Problem Relation Age of Onset  . Stroke Mother   . Stroke Father   . Hypertension Brother    History  Substance Use Topics  . Smoking status: Never Smoker   . Smokeless tobacco: Never Used  . Alcohol Use: No    Review of Systems  Respiratory: Negative for cough.   Cardiovascular: Positive for chest pain.  Gastrointestinal: Positive for nausea.  All other systems reviewed and are negative.     Allergies  Cialis; Levitra; and Viagra  Home Medications   Prior to Admission medications   Medication Sig Start Date End Date Taking?  Authorizing Provider  ALPRAZolam Prudy Feeler(XANAX) 0.5 MG tablet Every other day. As needed 01/25/11   Historical Provider, MD  amLODipine (NORVASC) 5 MG tablet TAKE 1 TABLET BY MOUTH EVERY DAY 01/27/13   Laurey Moralealton S McLean, MD  colchicine 0.6 MG tablet Take 1 tablet (0.6 mg total) by mouth daily. 03/30/11 03/29/12  Laurey Moralealton S McLean, MD  LamoTRIgine (LAMICTAL XR) 300 MG TB24 Take 1 tablet by mouth 2 (two) times daily.      Historical Provider, MD  levETIRAcetam (KEPPRA) 500 MG tablet Take 1,000 mg by mouth every 12 (twelve) hours.      Historical Provider, MD  MICARDIS 80 MG tablet Take 80 mg by mouth every morning.  02/06/11   Historical Provider, MD  Multiple Vitamin (MULTIVITAMIN) tablet Take 1 tablet by mouth daily.      Historical Provider, MD  omeprazole (PRILOSEC) 40 MG capsule Take 1 capsule (40 mg total) by mouth daily. 03/18/11 03/17/12  Vassie Lollarlos Madera, MD   BP 161/105 mmHg  Pulse 85  Temp(Src) 97.6 F (36.4 C)  Resp 18  SpO2 96% Physical Exam  Constitutional: He is oriented to person, place, and time. He appears well-developed and well-nourished.  HENT:  Head: Normocephalic.  Right Ear: External ear normal.  Left Ear: External ear normal.  Nose: Nose normal.  Mouth/Throat: Oropharynx is clear and moist.  Eyes: Conjunctivae and EOM are normal. Pupils are equal, round, and reactive to  light.  Neck: Normal range of motion.  Cardiovascular: Normal rate and normal heart sounds.   Pulmonary/Chest: Effort normal.  Abdominal: Soft. He exhibits no distension.  Musculoskeletal: Normal range of motion.  Neurological: He is alert and oriented to person, place, and time.  Skin: Skin is warm.  Psychiatric: He has a normal mood and affect.  Nursing note and vitals reviewed.   ED Course  Procedures (including critical care time) Labs Review Labs Reviewed  CBC WITH DIFFERENTIAL  COMPREHENSIVE METABOLIC PANEL  TROPONIN I    Imaging Review No results found.   EKG Interpretation None       MDM   Final diagnoses:  Pain     Elson AreasLeslie K Rossetta Kama, PA-C 04/05/14 1144  Derwood KaplanAnkit Nanavati, MD 04/07/14 [phone]

## 2014-04-05 NOTE — Consult Note (Signed)
Admit date: 04/05/2014 Referring Physician  Dr. Rhunette CroftNanavati Primary Physician  Duane Lopeoss, Alan, MD Primary Cardiologist  Shirlee LatchMcLean Reason for Consultation  Chest pain  HPI: 58 year old male who presents the emergency department with chest pain. He was last seen by Dr. Shirlee LatchMcLean in the clinic on 03/31/11 after description of pleuritic chest pain. At that time, he had EKG changes suggestive of acute pericarditis. Echocardiogram showed a small pericardial effusion. TSH was normal. Viral etiology. He also had cholecystectomy during that admission. Hypertension has been an issue for him. A NA was negative. His ascending aorta was 4.3 cm.  Dr. Shirlee LatchMcLean ordered an exercise treadmill test and this was performed on 04/14/2011 and low risk with no ischemia.  Today he comes in with left/right-sided chest pain, aching radiating to his right jaw that has been present over 2 weeks intermittent. No fevers, no cough. He has a sensation like he has had his jaw clenched. He has been under increased stress with his company. 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 Currently is chest pain-free.  Troponin in the emergency room is normal. Electrolytes are normal. Hemoglobin 14.3. Normal white count of 6.5.  EKG shows normal rhythm with no ST segment changes. No evidence of pericarditis.  PMH:   Past Medical History  Diagnosis Date  . Hypertension   . GERD (gastroesophageal reflux disease)   . Seizures     epileptic  . Epilepsy     PSH:   Past Surgical History  Procedure Laterality Date  . Kidney surgery    . Ureterectomy  1964  . Laparoscopic cholecystectomy w/ cholangiography  02/20/11   Allergies:  Cialis; Levitra; and Viagra Prior to Admit Meds:   Prior to Admission medications   Medication Sig Start Date End Date Taking? Authorizing Provider    ALPRAZolam Prudy Feeler(XANAX) 0.5 MG tablet Take 0.5 mg by mouth at bedtime as needed for sleep (sleep). As needed 01/25/11  Yes Historical Provider, MD  Cholecalciferol (VITAMIN D3) 1000 UNITS CAPS Take 1 capsule by mouth daily.   Yes Historical Provider, MD  LamoTRIgine (LAMICTAL XR) 300 MG TB24 Take 1 tablet by mouth 2 (two) times daily.     Yes Historical Provider, MD  levETIRAcetam (KEPPRA) 500 MG tablet Take 1,000 mg by mouth every 12 (twelve) hours.     Yes Historical Provider, MD  Multiple Vitamin (MULTIVITAMIN) tablet Take 1 tablet by mouth daily.     Yes Historical Provider, MD  NON FORMULARY Take 1 tablet by mouth daily. Herbal Xanax (Calm Spirit)   Yes Historical Provider, MD  omeprazole (PRILOSEC) 40 MG capsule Take 40 mg by mouth daily as needed (indigestion).   Yes Historical Provider, MD  amLODipine (NORVASC) 5 MG tablet TAKE 1 TABLET BY MOUTH EVERY DAY Patient not taking: Reported on 04/05/2014 01/27/13   Laurey Moralealton S McLean, MD  colchicine 0.6 MG tablet Take 1 tablet (0.6 mg total) by mouth daily. Patient not taking: Reported on 04/05/2014 03/30/11 03/29/12  Laurey Moralealton S McLean, MD  omeprazole (PRILOSEC) 40 MG capsule Take 1 capsule (40 mg total) by mouth daily. Patient not taking: Reported on 04/05/2014 03/18/11 03/17/12  Vassie Lollarlos Madera, MD   Fam HX:    Family History  Problem Relation Age of Onset  . Stroke Mother   . Stroke Father   . Hypertension Brother    Social HX:  History   Social History  . Marital Status: Married    Spouse Name: N/A    Number of Children: N/A  . Years of Education: N/A   Occupational History  . Not on file.   Social History Main Topics  . Smoking status: Never Smoker   . Smokeless tobacco: Never Used  . Alcohol Use: No  . Drug Use: Yes    Special: Marijuana     Comment: 2 years  . Sexual Activity: Yes   Other Topics Concern  . Not on file   Social History Narrative     ROS:  Denies any bleeding, syncope, orthopnea, PND All 11 ROS were  addressed and are negative except what is stated in the HPI   Physical Exam: Blood pressure 132/98, pulse 65, temperature 97.6 F (36.4 C), resp. rate 16, SpO2 98 %.   General: Well developed, well nourished, in no acute distress Head: Eyes PERRLA, No xanthomas.   Normal cephalic and atramatic  Lungs:   Clear bilaterally to auscultation and percussion. Normal respiratory effort. No wheezes, no rales. Heart:   HRRR S1 S2 Pulses are 2+ & equal. No murmur, rubs, gallops.  No carotid bruit. No JVD.  No abdominal bruits.  Abdomen: Bowel sounds are positive, abdomen soft and non-tender without masses. No hepatosplenomegaly. Msk:  Back normal. Normal strength and tone for age. Extremities:  No clubbing, cyanosis or edema.  DP +1 tattoos noted Neuro: Alert and oriented X 3, non-focal, MAE x 4 GU: Deferred Rectal: Deferred Psych:  Good affect, responds appropriately      Labs: Lab Results  Component Value Date   WBC 6.5 04/05/2014   HGB 14.3 04/05/2014   HCT 43.0 04/05/2014   MCV 90.9 04/05/2014   PLT 246 04/05/2014     Recent Labs Lab 04/05/14 1145  NA 142  K 4.0  CL 102  CO2 27  BUN 10  CREATININE 1.07  CALCIUM 9.7  PROT 7.4  BILITOT 0.5  ALKPHOS 82  ALT 17  AST 23  GLUCOSE 106*    Recent Labs  04/05/14 1145  TROPONINI <0.30   Lab Results  Component Value Date   CHOL 138 02/01/2011   HDL 37* 02/01/2011   LDLCALC 84 02/01/2011   TRIG 84 02/01/2011   Lab Results  Component Value Date   DDIMER <0.22 01/30/2011     Radiology:  Dg Chest 2 View  04/05/2014   CLINICAL DATA:  Right chest pain radiating to jaw  EXAM: CHEST  2 VIEW  COMPARISON:  03/17/2011  FINDINGS: Cardiomediastinal silhouette is stable. No acute infiltrate or pleural effusion. No pulmonary edema. Stable degenerative changes thoracic spine.  IMPRESSION: No active cardiopulmonary disease.   Electronically Signed   By: Natasha MeadLiviu  Pop M.D.   On: 04/05/2014 12:34   Personally viewed.  EKG:  As above,  no ST segment changes Personally viewed.   ASSESSMENT/PLAN:    58 year old male with atypical chest pain over the past 2 weeks with prior history of pericarditis.  1. Atypical chest pain-troponin is reassuring  especially given the duration of his chest pain. No evidence currently of pericarditis. EKG is also reassuring. It would not be unreasonable for us to set him up for evaluation with nuclear stress test. He had an exercise treadmill test done in 2012 which was low risk. I will set up nuclear stress test in the outpatient setting. Patient is amenable to this plan. I'm comfortable with his discharge from the emergency room. Certainly heightened  stress, anxiety regarding his upcoming trip to [place] may be playing a role in his current symptoms. Nonetheless, further cardiac evaluation is warranted.  2. Hypertension-continue to encourage medication use. Continue to work with Dr. Tenny Craw.  3. Anxiety-he did ask me at the end for a prescription for Xanax. I politely declined. He should discuss this further with Dr. Tenny Craw.  Donato Schultz, MD  04/05/2014  2:25 PM

## 2014-04-05 NOTE — ED Notes (Addendum)
Pt co upper right chest pain radiating to right jaw. Pain started two weeks ago yett it started to jaw today. Pt also co dizz, yet denies SOB and  no nausea. Hx of HTN and seizures. Had one episode of seizure last Sunday. Pt on Keppra, last dose this AM.

## 2014-04-05 NOTE — Discharge Instructions (Signed)
Aspirin and Your Heart °Aspirin affects the way your blood clots and helps "thin" the blood. Aspirin has many uses in heart disease. It may be used as a primary prevention to help reduce the risk of heart related events. It also can be used as a secondary measure to prevent more heart attacks or to prevent additional damage from blood clots.  °ASPIRIN MAY HELP IF YOU: °· Have had a heart attack or chest pain. °· Have undergone open heart surgery such as CABG (Coronary Artery Bypass Surgery). °· Have had coronary angioplasty with or without stents. °· Have experienced a stroke or TIA (transient ischemic attack). °· Have peripheral vascular disease (PAD). °· Have chronic heart rhythm problems such as atrial fibrillation. °· Are at risk for heart disease. °BEFORE STARTING ASPIRIN °Before you start taking aspirin, your caregiver will need to review your medical history. Many things will need to be taken into consideration, such as: °· Smoking status. °· Blood pressure. °· Diabetes. °· Gender. °· Weight. °· Cholesterol level. °ASPIRIN DOSES °· Aspirin should only be taken on the advice of your caregiver. Talk to your caregiver about how much aspirin you should take. Aspirin comes in different doses such as: °· 81 mg. °· 162 mg. °· 325 mg. °· The aspirin dose you take may be affected by many factors, some of which include: °· Your current medications, especially if your are taking blood-thinners or anti-platelet medicine. °· Liver function. °· Heart disease risk. °· Age. °· Aspirin comes in two forms: °· Non-enteric-coated. This type of aspirin does not have a coating and is absorbed faster. Non-enteric coated aspirin is recommended for patients experiencing chest pain symptoms. This type of aspirin also comes in a chewable form. °· Enteric-coated. This means the aspirin has a special coating that releases the medicine very slowly. Enteric-coated aspirin causes less stomach upset. This type of aspirin should not be chewed  or crushed. °ASPIRIN SIDE EFFECTS °Daily use of aspirin can increase your risk of serious side effects. Some of these include: °· Increased bleeding. This can range from a cut that does not stop bleeding to more serious problems such as stomach bleeding or bleeding into the brain (Intracerebral bleeding). °· Increased bruising. °· Stomach upset. °· An allergic reaction such as red, itchy skin. °· Increased risk of bleeding when combined with non-steroidal anti-inflammatory medicine (NSAIDS). °· Alcohol should be drank in moderation when taking aspirin. Alcohol can increase the risk of stomach bleeding when taken with aspirin. °· Aspirin should not be given to children less than 18 years of age due to the association of Reye syndrome. Reye syndrome is a serious illness that can affect the brain and liver. Studies have linked Reye syndrome with aspirin use in children. °· People that have nasal polyps have an increased risk of developing an aspirin allergy. °SEEK MEDICAL CARE IF:  °· You develop an allergic reaction such as: °· Hives. °· Itchy skin. °· Swelling of the lips, tongue or face. °· You develop stomach pain. °· You have unusual bleeding or bruising. °· You have ringing in your ears. °SEEK IMMEDIATE MEDICAL CARE IF:  °· You have severe chest pain, especially if the pain is crushing or pressure-like and spreads to the arms, back, neck, or jaw. THIS IS AN EMERGENCY. Do not wait to see if the pain will go away. Get medical help at once. Call your local emergency services (911 in the [place] DO NOT drive yourself to the hospital. °· You have stroke-like symptoms   such as: °· Loss of vision. °· Difficulty talking. °· Numbness or weakness on one side of your body. °· Numbness or weakness in your arm or leg. °· Not thinking clearly or feeling confused. °· Your bowel movements are bloody, dark red or black in color. °· You vomit or cough up blood. °· You have blood in your urine. °· You have shortness of breath,  coughing or wheezing. °MAKE SURE YOU:  °· Understand these instructions. °· Will monitor your condition. °· Seek immediate medical care if necessary. °Document Released: 03/26/2008 Document Revised: 08/08/2012 Document Reviewed: 07/19/2013 °ExitCare® Patient Information ©2015 [employer]. This information is not intended to replace advice given to you by your health care provider. Make sure you discuss any questions you have with your health care provider. ° °Chest Pain (Nonspecific) °It is often hard to give a specific diagnosis for the cause of chest pain. There is always a chance that your pain could be related to something serious, such as a heart attack or a blood clot in the lungs. You need to follow up with your health care provider for further evaluation. °CAUSES  °· Heartburn. °· Pneumonia or bronchitis. °· Anxiety or stress. °· Inflammation around your heart (pericarditis) or lung (pleuritis or pleurisy). °· A blood clot in the lung. °· A collapsed lung (pneumothorax). It can develop suddenly on its own (spontaneous pneumothorax) or from trauma to the chest. °· Shingles infection (herpes zoster virus). °The chest wall is composed of bones, muscles, and cartilage. Any of these can be the source of the pain. °· The bones can be bruised by injury. °· The muscles or cartilage can be strained by coughing or overwork. °· The cartilage can be affected by inflammation and become sore (costochondritis). °DIAGNOSIS  °Lab tests or other studies may be needed to find the cause of your pain. Your health care provider may have you take a test called an ambulatory electrocardiogram (ECG). An ECG records your heartbeat patterns over a 24-hour period. You may also have other tests, such as: °· Transthoracic echocardiogram (TTE). During echocardiography, sound waves are used to evaluate how blood flows through your heart. °· Transesophageal echocardiogram (TEE). °· Cardiac monitoring. This allows your health care provider  to monitor your heart rate and rhythm in real time. °· Holter monitor. This is a portable device that records your heartbeat and can help diagnose heart arrhythmias. It allows your health care provider to track your heart activity for several days, if needed. °· Stress tests by exercise or by giving medicine that makes the heart beat faster. °TREATMENT  °· Treatment depends on what may be causing your chest pain. Treatment may include: °¨ Acid blockers for heartburn. °¨ Anti-inflammatory medicine. °¨ Pain medicine for inflammatory conditions. °¨ Antibiotics if an infection is present. °· You may be advised to change lifestyle habits. This includes stopping smoking and avoiding alcohol, caffeine, and chocolate. °· You may be advised to keep your head raised (elevated) when sleeping. This reduces the chance of acid going backward from your stomach into your esophagus. °Most of the time, nonspecific chest pain will improve within 2-3 days with rest and mild pain medicine.  °HOME CARE INSTRUCTIONS  °· If antibiotics were prescribed, take them as directed. Finish them even if you start to feel better. °· For the next few days, avoid physical activities that bring on chest pain. Continue physical activities as directed. °· Do not use any tobacco products, including cigarettes, chewing tobacco, or electronic cigarettes. °·   Avoid drinking alcohol. °· Only take medicine as directed by your health care provider. °· Follow your health care provider's suggestions for further testing if your chest pain does not go away. °· Keep any follow-up appointments you made. If you do not go to an appointment, you could develop lasting (chronic) problems with pain. If there is any problem keeping an appointment, call to reschedule. °SEEK MEDICAL CARE IF:  °· Your chest pain does not go away, even after treatment. °· You have a rash with blisters on your chest. °· You have a fever. °SEEK IMMEDIATE MEDICAL CARE IF:  °· You have increased  chest pain or pain that spreads to your arm, neck, jaw, back, or abdomen. °· You have shortness of breath. °· You have an increasing cough, or you cough up blood. °· You have severe back or abdominal pain. °· You feel nauseous or vomit. °· You have severe weakness. °· You faint. °· You have chills. °This is an emergency. Do not wait to see if the pain will go away. Get medical help at once. Call your local emergency services (911 in [place] Do not drive yourself to the hospital. °MAKE SURE YOU:  °· Understand these instructions. °· Will watch your condition. °· Will get help right away if you are not doing well or get worse. °Document Released: 01/21/2005 Document Revised: 04/18/2013 Document Reviewed: 11/17/2007 °ExitCare® Patient Information ©2015 [employer]. This information is not intended to replace advice given to you by your health care provider. Make sure you discuss any questions you have with your health care provider. ° °

## 2015-12-13 ENCOUNTER — Other Ambulatory Visit: Payer: Self-pay | Admitting: Gastroenterology

## 2016-04-27 DEATH — deceased

## 2017-08-25 DEATH — deceased

## 2017-10-25 DEATH — deceased

## 2018-11-10 ENCOUNTER — Telehealth (HOSPITAL_BASED_OUTPATIENT_CLINIC_OR_DEPARTMENT_OTHER): Payer: Self-pay | Admitting: Family Medicine

## 2018-11-10 NOTE — Telephone Encounter (Signed)
RETURN CALL: Voicemail - Detailed Message      SUBJECT:  General Message     [employer] Work[employer] from [employer] in [place], [place] is reaching out to the [facility] worker in [place] clinic per patient was referred however not yet established and reaching out per patients unique situation

## 2018-12-01 ENCOUNTER — Inpatient Hospital Stay: Payer: Self-pay

## 2019-01-16 ENCOUNTER — Inpatient Hospital Stay: Payer: Self-pay

## 2019-06-01 ENCOUNTER — Inpatient Hospital Stay: Payer: Self-pay

## 2019-06-05 ENCOUNTER — Inpatient Hospital Stay: Payer: Self-pay

## 2019-06-22 ENCOUNTER — Inpatient Hospital Stay: Payer: Self-pay

## 2019-07-21 ENCOUNTER — Encounter (HOSPITAL_COMMUNITY): Payer: Self-pay

## 2019-07-22 NOTE — Historical Transplant Communications (Signed)
Rydge, Greig [id number])  Transplant Communications  ____________________________________________________________________________________    Date: 01/26/2019  Type: Action  Entry By: [id number]  Physician reviewed. Delivered to [facility].  ____________________________________________________________________________________    Date: 12/08/2018  Type: Medical info  Entry By: [email or web address]  Pt referred for liver transplantation but currently homeless, and without transportation to come to [place]. Has known CAD with NSTEMI 06/2017 with EF 50-55% on last echo. INR 1.1. TB 1.1. SO, will decline referral for transplantation due to unstable housing.  ____________________________________________________________________________________    Date: 04/18/2018  Type: Action  Entry By: Bland Span  Physician reviewed. Delivered to [facility].  ____________________________________________________________________________________    Date: 03/09/2008  Type: Chart in review  Entry By: [email or web address]  Pt seen in [facility] 8/11 and should be seen by hepatology in 02/2008. Request to Ronalee Belts to schedule with labs.  ____________________________________________________________________________________    Date: 02/02/2008  Type: Action  Entry By: [id number]  Fax'd 9/29 nutrition note, Abd [place] and Echo to Dr. Alene Mires.  ____________________________________________________________________________________    Date: 12/06/2007  Type: Medical info  Entry By: [email or web address]  Patient seen in [facility] with a MELD of 7. Pt is too early for transplant and will need 3 month follow-up in Hepatology in 11/09 with labs.  ____________________________________________________________________________________    Date: 12/06/2007  Type: Medical info  Entry By: [email or web address]  Pt was seen in [facility] and was scheduled for dietary consult, abdominal ultrasound and echo 01/24/08. Per Dr. Corky Sing liou pt will need to follow-up in Hepatology clinic in November. Letter giving to pt at time of [facility]  clinic.  ____________________________________________________________________________________    Date: 11/25/2007  Type: Action  Entry By: [id number]  Mailed NEW appointment packet.  ____________________________________________________________________________________    Date: 11/11/2007  Type: Action  Entry By: [email or web address]  Scheduled [facility] for 8.11.09 at 10:00am; confirmation sent.  ____________________________________________________________________________________    Date: 11/11/2007  Type: Financial  Entry By: [email or web address]  per Jerre Simon, fin cleared, [facility]/wu eval.  ____________________________________________________________________________________    Date: 11/07/2007  Type: Financial  Entry By: [email or web address]  Request to [employer], review/approve fin clearance, [facility]/wu eval (no known ins to date; may need to contact pt).   ____________________________________________________________________________________    Date: 11/07/2007  Type: Referral  Entry By: [email or web address]  Insurance verification pending.  ____________________________________________________________________________________    Date: 11/01/2007  Type: Referral  Entry By: [email or web address]  Processed first liver referral; notification sent.

## 2019-08-18 ENCOUNTER — Emergency Department: Payer: Self-pay

## 2019-08-18 ENCOUNTER — Inpatient Hospital Stay: Payer: Self-pay

## 2019-09-30 ENCOUNTER — Inpatient Hospital Stay: Payer: Self-pay

## 2019-09-30 ENCOUNTER — Emergency Department: Payer: Self-pay

## 2019-11-15 ENCOUNTER — Telehealth (HOSPITAL_BASED_OUTPATIENT_CLINIC_OR_DEPARTMENT_OTHER): Payer: Self-pay

## 2019-11-15 DIAGNOSIS — K7031 Alcoholic cirrhosis of liver with ascites: Secondary | ICD-10-CM

## 2019-11-15 DIAGNOSIS — Z01818 Encounter for other preprocedural examination: Secondary | ICD-10-CM

## 2019-11-15 NOTE — Telephone Encounter (Signed)
Tx referral recieved

## 2019-12-08 ENCOUNTER — Encounter (HOSPITAL_BASED_OUTPATIENT_CLINIC_OR_DEPARTMENT_OTHER): Payer: Self-pay | Admitting: Gastroenterology

## 2019-12-08 ENCOUNTER — Other Ambulatory Visit (HOSPITAL_BASED_OUTPATIENT_CLINIC_OR_DEPARTMENT_OTHER): Payer: Self-pay | Admitting: Gastroenterology

## 2019-12-08 DIAGNOSIS — Z01818 Encounter for other preprocedural examination: Secondary | ICD-10-CM

## 2019-12-26 ENCOUNTER — Encounter (HOSPITAL_BASED_OUTPATIENT_CLINIC_OR_DEPARTMENT_OTHER): Payer: No Typology Code available for payment source

## 2020-01-09 ENCOUNTER — Encounter (HOSPITAL_BASED_OUTPATIENT_CLINIC_OR_DEPARTMENT_OTHER): Payer: Medicare Other

## 2020-01-15 ENCOUNTER — Ambulatory Visit: Payer: Medicare Other | Attending: Gastroenterology | Admitting: Gastroenterology

## 2020-01-15 VITALS — BP 130/56 | HR 67 | Temp 98.4°F | Ht 67.4 in | Wt 140.7 lb

## 2020-01-15 DIAGNOSIS — K7290 Hepatic failure, unspecified without coma: Secondary | ICD-10-CM | POA: Insufficient documentation

## 2020-01-15 DIAGNOSIS — Z01818 Encounter for other preprocedural examination: Secondary | ICD-10-CM | POA: Insufficient documentation

## 2020-01-15 DIAGNOSIS — I2510 Atherosclerotic heart disease of native coronary artery without angina pectoris: Secondary | ICD-10-CM | POA: Insufficient documentation

## 2020-01-15 DIAGNOSIS — Z8719 Personal history of other diseases of the digestive system: Secondary | ICD-10-CM

## 2020-01-15 DIAGNOSIS — I2584 Coronary atherosclerosis due to calcified coronary lesion: Secondary | ICD-10-CM | POA: Insufficient documentation

## 2020-01-15 DIAGNOSIS — Z1289 Encounter for screening for malignant neoplasm of other sites: Secondary | ICD-10-CM

## 2020-01-15 DIAGNOSIS — I5022 Chronic systolic (congestive) heart failure: Secondary | ICD-10-CM | POA: Insufficient documentation

## 2020-01-15 DIAGNOSIS — K7682 Hepatic encephalopathy: Secondary | ICD-10-CM

## 2020-01-15 DIAGNOSIS — K7031 Alcoholic cirrhosis of liver with ascites: Secondary | ICD-10-CM

## 2020-01-15 LAB — LIVER TRX, OP PANEL W/ DIFF
% Basophils: 1 %
% Eosinophils: 2 %
% Immature Granulocytes: 0 %
% Lymphocytes: 9 %
% Monocytes: 15 %
% Neutrophils: 73 %
% Nucleated RBC: 0 %
ALT (GPT): 17 U/L (ref 10–48)
AST (GOT): 28 U/L (ref 9–38)
Absolute Eosinophil Count: 0.08 10*3/uL (ref 0.00–0.50)
Absolute Lymphocyte Count: 0.37 10*3/uL — ABNORMAL LOW (ref 1.00–4.80)
Albumin: 3.2 g/dL — ABNORMAL LOW (ref 3.5–5.2)
Alkaline Phosphatase (Total): 130 U/L (ref 37–159)
Anion Gap: 7 (ref 4–12)
Basophils: 0.04 10*3/uL (ref 0.00–0.20)
Bilirubin (Direct): 0.4 mg/dL — ABNORMAL HIGH (ref 0.0–0.3)
Bilirubin (Total): 0.9 mg/dL (ref 0.2–1.3)
Calcium: 8.9 mg/dL (ref 8.9–10.2)
Carbon Dioxide, Total: 22 meq/L (ref 22–32)
Chloride: 106 meq/L (ref 98–108)
Creatinine: 1.32 mg/dL — ABNORMAL HIGH (ref 0.51–1.18)
Gamma Glutamyl Transferase: 80 U/L — ABNORMAL HIGH (ref 0–55)
Glucose: 95 mg/dL (ref 62–125)
Hematocrit: 25 % — ABNORMAL LOW (ref 38–50)
Hemoglobin: 7.7 g/dL — ABNORMAL LOW (ref 13.0–18.0)
Immature Granulocytes: 0.01 10*3/uL (ref 0.00–0.05)
MCH: 27.7 pg (ref 27.3–33.6)
MCHC: 30.6 g/dL — ABNORMAL LOW (ref 32.2–36.5)
MCV: 91 fL (ref 81–98)
Magnesium: 2 mg/dL (ref 1.8–2.4)
Monocytes: 0.6 10*3/uL (ref 0.00–0.80)
Neutrophils: 3.05 10*3/uL (ref 1.80–7.00)
Nucleated RBC: 0 10*3/uL
Phosphate: 3.9 mg/dL (ref 2.5–4.5)
Platelet Count: 89 10*3/uL — ABNORMAL LOW (ref 150–400)
Potassium: 4.9 meq/L (ref 3.6–5.2)
Protein (Total): 7.8 g/dL (ref 6.0–8.2)
Prothrombin INR: 1.3 (ref 0.8–1.3)
Prothrombin Time Patient: 15.6 s (ref 10.7–15.6)
RBC: 2.78 10*6/uL — ABNORMAL LOW (ref 4.40–5.60)
RDW-CV: 17.8 % — ABNORMAL HIGH (ref 11.6–14.4)
Sodium: 135 meq/L (ref 135–145)
Urea Nitrogen: 29 mg/dL — ABNORMAL HIGH (ref 8–21)
WBC: 4.15 10*3/uL — ABNORMAL LOW (ref 4.3–10.0)
eGFR by CKD-EPI: 58 mL/min/{1.73_m2} — ABNORMAL LOW (ref >59)

## 2020-01-15 NOTE — Patient Instructions (Signed)
Thank you for visiting with me today. Here are the things I recommend from today's visit:    1) Liver Transplant Candidacy - Because of your heart function you are not a candidate for transplant. The operation would be too risky for your heart. Please continue to manage your heart condition with your cardiologist. Your liver synthetic function is relatively intact.    2) Fluid Management - Continue your current medications. As able try to limit sodium as much as possible.    3) Cancer Screening - You should have abdominal imaging every 6 months to assess for possible cancer of the liver, as having cirrhosis can predispose you for cancer.

## 2020-01-15 NOTE — Progress Notes (Addendum)
HEPATOLOGY CLINIC INITIAL CONSULTATION NOTE    OTHER PROVIDERS  PCP Kris Mouton, Conan Bowens, MD  GI: Myles Gip, MD   Cardiology: Colin Broach, NP    IDENTIFICATION/CHIEF COMPLAINT  [name] is a 64 year old male sent in consultation by Dr. Myles Gip for evaluation of alcoholic cirrhosis and liver transplant and TIPS consideration.    HISTORY OF PRESENT ILLNESS  [name] is a 64 year old male with alcohol related cirrhosis (quit drinking in 2019).  He has had refractory ascites with inability to tolerate incraesed diuretics. He first learned he had cirrhosis in the 2000's. He quit drinking for approximately 10 years at this time. His wife died and he restarted drinking. He stopped again in 2019 and has remained abstinent. For volume management, he previously he required paracentesis every other week. He has not had a paracentesis in over one month, without change in medication (but is on sodium restriction to the best of his ability).  He has been on 20 mg daily of lasix for one year and he does not wish to increase as he beleives the increased dosed caused him to be put in a walker (at one point he was on 60 mg daily).    He had a recent admission 09/30/19 for AMS. His diagnostic paracentesis was negative for SPB.  He was found to have STEMI and underwent LAD bare metal stent placement on 10/03/19. He has 3 vessel disease noted on coronary angiogram. His LVEF was reduced at 30-35% on his echocardiogram on 6/11, which has not been repeated yet. He was recently seen in follow-up in cardiology clinic and a follow-up echocardiogram has been ordered. He was discharged on lactulose and rifaximin for control of his hepatic encephalopathy.  His bowel movements are toothpaste consistency three times per day. They are brown/blond in color. He denies any current abdominal pain or GI bleeding. He comes alone to his clinic appointment today.    PAST MEDICAL HISTORY  1. Alcoholic cirrhosis, complicated by history  of ascites, and esophageal varices with history of bleeding requiring banding.  2. History of Hodkins Lymphoma: He had hodgkins lymphoma in 2008. He had chemotherapy and radiation.  He last saw these physicians over 10 years ago.  3. Coronary artery disease. History of NSTEMI 06/2017 (nonsurgical candidate due to severe co-morbidities and severe diffuse complex disease.). Then STEMI 09/2019, s/p bare metal stent x 2 to LAD. 3 vessel disease: also has residual 70% proximal RCA and 70% proximal left CCX disease.)   4. Severe LV dysfunction noted on 09/2019 echo with LVEF of 30% with moderate LVH, mild diastolic dysfunction. (Previous EF 55% in 2019).  5. History of nonsustained wide complex tachycardia.    ALLERGIES  Review of patient's allergies indicates:  Not on File     MEDICATIONS  Current Medications:  Asprin 81 mg  Atorvastatin 80 mg  Carvedilol 6.25 mg  Clopidogrel 75 mg.  Ferrouis Sulfate 325 mg  Fluticasone 50 mcg  Furosemide 20 mg  Lactulose 10 mg TID  Lisinopril 5 mg  Omeprazole 20 mg  Spironolactone 50 mg.  Xifaxan 550 mg.    SOCIAL HISTORY  He lives in [place]. Takes medical taxi here    REVIEW OF SYSTEMS    Constitutional: Negative for fever  Eyes: Negative for scleral icterus   Cardiovascular: Negative for peripheral edema  Respiratory: Negative for shortness of breath  Gastrointestinal: Negative, no significant dyspepsia, heartburn, constipation, diarrhea.  Musculoskeletal: Positive for weakness.  Skin: Negative for jaundice  Neurological: Negative for asterixis  Psychiatric: Negative for sleep disturbance  Hematologic/Lymphatic: Positive for easy bruising.  Allergic/Immunologic: Negative for recurrent infections    PHYSICAL EXAM  BP 130/56   Pulse 67   Temp 36.9 C (Temporal)   Ht 5' 7.4" (1.712 m)   Wt 63.8 kg (140 lb 11.2 oz)   SpO2 100% Comment: ra  BMI 21.77 kg/m     Constitutional/ General Appearance: alert and oriented x 3, Alert, no distress  Eyes: EOMI, no scleral icterus.  Oropharynx:  Oral mucosa moist without erythema. Teeth not present.  Neck: Neck supple.  Lungs: Clear to auscultation.  Heart: Normal rate, regular rhythm. No murmur,click, or gallop. Heart sounds distant.  Abd: Abdomen tense and distended. Midline hernia present. Positive bowel sounds.  Ext: Trace peripheral edema.  Neuro: Moving all extremities, no asterixis.  Skin: Skin color, texture, turgor normal. No rashes or concerning lesions. No jaundice.    LABORATORY TESTS/STUDIES  7/19 Labs  Hgb - 6.9  Na -135  Creatinine 1.27    7/8 Labs  Creatinine 1.72  Albumin 2.8  AST - 37  ALT 23  Tbili - 0.9    7/5 Lab  INR - 1.1    Labs (last 24 hours):   Chemistries  CBC  LFT  Gases, other   135 106 29 95   7.7   AST: 28 ALT: 17  -/-/-/-  -/-/-/-   4.9 22 1.32   4.15 >< 89  AP: 130 T bili: 0.9  Lact (a): - Lact (v): -   eGFR: 58 Ca: 8.9   25   Prot: 7.8 Alb: 3.2  Trop I: - D-dimer: -   Mg: 2.0 PO4: 3.9  ANC: 3.05     BNP: - Anti-Xa: -     ALC: 0.37    INR: 1.3      MELD-Na score: 14 at 01/15/2020  2:09 PM  MELD score: 12 at 01/15/2020  2:09 PM  Calculated from:  Serum Creatinine: 1.32 mg/dL at 01/15/2020  2:09 PM  Serum Sodium: 135 meq/L at 01/15/2020  2:09 PM  Total Bilirubin: 0.9 mg/dL (Rounded to 1 mg/dL) at 01/15/2020  2:09 PM  INR(ratio): 1.3 at 01/15/2020  2:09 PM  Age: 3 years 9 months    06/24/19 CT Abdomen/Pelvis with Contrast  CT ABDOMEN PELVIS W CONTRAST    Clinical: oral gastrografffin follow through to eval for small bowel  obstruction.    Comparison: 06/22/2019    Procedure:  Multislice CT imaging was performed with intravenous contrast. CT  Angiography included post-processing. Coronal and sagittal maximal  intensity projected(MIP) images were performed. Contrast: 100 cc  Isovue-370 was administered. CT exam was performed using one or more  of the following dose reduction techniques: Automated exposure  control, adjustment of the mA and/or kV according to patient size, or  use of iterative reconstruction technique.    Findings:    There is evidence of a persistent partial small bowel obstruction with  asymmetrically distended small bowel loops within the right upper  abdomen and right lower quadrant. Small bowel measures up to 3.3 cm.  No pneumatosis. No free intracranial air. Transition site noted within  the right lower quadrant and right upper quadrant (image 69, series 2;  image 86, series 2 normal caliber distal small bowel loops right lower  quadrant. Contrast does extend through the small bowel into the colon.    The remainder the examination is a unchanged.  Impression:   1. Findings consistent with partial small bowel obstruction likely due  to adhesions in the right upper and right lower abdomen.  2. Contrast extends into the colon.    10/03/19 CT chest/abdomen/pelvis WITHOUT contrast  CT CHEST ABDOMEN PELVIS WO CONTRAST    Clinical: Worsening encephalopathy, lactic acidosis, hypoxemia    Comparison: CT abdomen pelvis without contrast 08/18/2019    Procedure: CT imaging was performed without intravenous contrast. CT  exam was performed using one or more of the following dose reduction  techniques: Automated exposure control, adjustment of the mA and/or kV  according to patient size, or use of iterative reconstruction  technique.    Findings:  CT CHEST: Scattered patchy airspace and groundglass opacities  scattered within both lungs. Small right pleural effusion.    Heart size at the upper range of normal. Severely calcified coronary  arteries.    CT ABDOMEN AND PELVIS: Moderate volume ascites. Moderate distention of  small bowel segments, slightly increased compared to 08/18/2019. A  small bowel obstruction is suspected, due to transition of caliber of  segments within the right abdomen, similar to the prior exam of  08/18/2019. Although a ventral/periumbilical hernia is with  fluid-filled bowel segments, this does not appear to be the area of  transition.    Gas within the mesentery, best seen on axial images 146 at 158, series  3.  Suspected pneumatosis of adjacent bowel segments, best seen on  axial image 157/3. These findings are new since the prior CT of  08/18/2019.    Cirrhotic liver. Spleen is enlarged measuring approximately 13 cm  length. Gastric tube tip at the body of the stomach.    A 3 mm calcified stone within the distal common bile duct as seen on  prior CT. No intrahepatic bile duct of the patient.    Extensive vascular calcifications. Calcified plaque at the origin and  proximal segment of the superior mesenteric artery Normal size of the  prostate. Foley catheter within the urinary bladder.    BONY STRUCTURES: Chronic mild compression fracture at the superior  endplate of L1. Degenerative changes of the spine, most prominent at  the lower lumbar levels.    IMPRESSION:  1. Moderately distended small bowel segments, consistent with small  bowel obstruction. The zone of transition appears to be within the  right mid abdomen, with suspected adhesion, similar to the prior CT of  08/18/2019. A ventral/periumbilical hernia containing fluid-filled  small bowel is unchanged from prior.  2. Gas within the mesentery and likely bowel wall pneumatosis within  the midabdomen, consistent with bowel ischemia/infarction.  3. Moderate volume ascites, increased compared to 08/18/2019. Cirrhotic  liver and splenomegaly.  4. A 3 mm stone at the distal common bile duct, however no  intrahepatic bile duct dilatation.  5. Small right pleural effusion and new scattered bilateral lung  infiltrates, consistent with pneumonia (possible CoVid).  6. Severely calcified coronary arteries. Extensive atherosclerotic  plaque involving the aorta, iliac arteries, and mesenteric arteries.    10/14/19 Cardiac cath  FINDINGS:  1. LV pressure is 90/9 with LVEDP of 20 mmHg. AO pressure is 87/48 with   a mean of 66 mmHg.  2. RCA: RCA is a moderate caliber size vessel. Proximal RCA has 60 to   70% focal stenosis. Mid RCA has mild diffuse 10 to 20% stenosis. RCA is a  dominant   vessel, gives rise to moderate caliber size PDA, which mid junction has 20 to 30%  focal stenosis. Posterolateral branch is a moderate caliber size vessel and   patent. RCA is moderately calcified in the mid junction.  3. Left main: Left main is moderate caliber size vessel and patent. Left   main appears to be calcified with calcium extending to the aortic root.  5. LAD: LAD is a moderate caliber size vessel. Ostial LAD has subtotal   99% calcified stenosis with TIMI 1 flow in the LAD proper. Proximal LAD has a 30 to 40%   Stenosis.  LAD has another 40 to 50% focal stenosis. LAD gives rise to small to moderate caliber size diagonal artery, which have mild luminal irregularity.  6. Circumflex: Left circumflex is a moderate caliber size vessel. Mid circumflex has eccentric 70% stenosis. It gives rise to moderate caliber size first and second obtuse marginal arteries, which has mild luminal irregularity. Distal circumflex gives rise to the third obtuse marginal which is small caliber size vessel and patent.   7. Ramus: The ramus appears to be a 2 mm caliber size vessel with ostial   70% stenosis followed by mid junction 20 to 30% stenosis.    CONCLUSION Successful IVUS-guided PTCA of ostial and proximal LAD   reducing 99% stenosis to 0%, improving TIMI flow from 1 to 3.    RECOMMENDATIONS:  1. Aspirin 81 mg p.o. daily indefinitely.  2. Plavix 75 mg p.o. daily for a minimum of 1 month, ideally up to 1   year.  3. Outpatient FFR-guided PCI of circumflex and proximal RCA. If patient   can exhibit adherence to dual antiplatelet therapy and medical compliance, with no GI   bleed on dual antiplatelet therapy.     10/06/19 Echocardiogram  Conclusions  Left ventricular ejection fraction is 30-35%.  There is severe hypokinesis to akinesis of mid to distal anterior, anteroseptal,  apical and distal inferior walls. No apical thrombus on contrast images.  Normal pericardium appearance without  effusion.    ASSESSMENT/PLAN  [name] is a 64 year old male sent in consultation by Dr. Myles Gip for evaluation of alcoholic cirrhosis and liver transplant and TIPS consideration.     1. Alcoholic cirrhosis, decompensated with history of ascites and varices. Though the patient has a history of decompensated liver disease with ascites and history of variceal hemorrhage, his recent MELD score remains low (MELD <16) with normal total bilirubin and normal INR, so he would not have much wait list priority. In addition, his ascites could be multifactorial, as he did have left heart dysfunction at the time of his STEMI, and his ascites has improved with decreased need for therapeutic paracentesis.  Thus, his MELD score would not provide him much priority for transplantation. In his significant 3 vessel coronary artery disease with recent STEMI requiring 2 bare metal stents and significant cardiac dysfunction EF 30% with a reduced EF would be contraindications to liver transplant candidacy.  Thus, we will not be pursuing a liver transplant evaluation at this time.    2. Ascites. He has a history of ascites, initially requiring paracentesis every 1-2 weeks, which his now reduced to less than once a month or so, with only low dose diuretics. Thus, he does not need evaluation for TIPS. Moreover, his left sided cardiac dysfunction would be a relative contraindication. He may continue with his diuretics and low sodium diet, with prn paracenteses as needed.    3. Hepatic encephalopathy. He appears to be well controlled on the combination of rifaximin and lactulose.  4. Varices with possible history of GI bleeding. The patient had an upper endoscopy in 06/2019 and no varices were seen then. He remains on carvedilol.    5. Hepatocellular carcinoma screening. The patient has not had a recent US or multiphase imaging study for liver cancer screening. He was encourage to get a repeat US of the liver done in the near  future. He should continue liver cancer screening every 6 months.    No routine follow-up will be planned in our hepatology clinic at this time. He will continue to follow-up with his primary GI and primary care providers for his routine liver care.    Thank you for allowing [place] to participate in the care of this patient. Please do not hesitate to call with any questions or concerns.    This patient was seen and evaluated with Hepatology Attending, Dr. Hillard Danker.

## 2020-01-22 DIAGNOSIS — K7031 Alcoholic cirrhosis of liver with ascites: Secondary | ICD-10-CM | POA: Insufficient documentation

## 2020-01-22 DIAGNOSIS — I5022 Chronic systolic (congestive) heart failure: Secondary | ICD-10-CM | POA: Insufficient documentation

## 2020-01-22 DIAGNOSIS — I2510 Atherosclerotic heart disease of native coronary artery without angina pectoris: Secondary | ICD-10-CM | POA: Insufficient documentation

## 2020-01-22 NOTE — Addendum Note (Signed)
Addended by: Sherolyn Buba on: 01/22/2020 04:05 PM     Modules accepted: Level of Service

## 2020-01-22 NOTE — Progress Notes (Signed)
I saw and evaluated the patient. I have reviewed Dr. Marciano Sequin documentation and agree with it.

## 2020-05-14 ENCOUNTER — Inpatient Hospital Stay: Payer: Self-pay

## 2020-05-14 ENCOUNTER — Emergency Department: Payer: Self-pay

## 2020-05-22 ENCOUNTER — Inpatient Hospital Stay: Payer: Self-pay

## 2020-11-04 ENCOUNTER — Other Ambulatory Visit (HOSPITAL_COMMUNITY): Payer: Medicare Other

## 2020-11-04 ENCOUNTER — Emergency Department: Payer: Self-pay

## 2020-11-04 ENCOUNTER — Other Ambulatory Visit (HOSPITAL_COMMUNITY): Payer: Self-pay

## 2020-11-05 ENCOUNTER — Encounter (HOSPITAL_COMMUNITY): Payer: Self-pay | Admitting: Anesthesiology

## 2020-11-05 ENCOUNTER — Other Ambulatory Visit: Payer: Self-pay

## 2020-11-05 ENCOUNTER — Inpatient Hospital Stay (HOSPITAL_COMMUNITY): Payer: Medicare Other | Admitting: Critical Care Medicine

## 2020-11-05 ENCOUNTER — Encounter (HOSPITAL_COMMUNITY)
Admission: AD | Disposition: A | Discharge status: Home Health | Payer: Self-pay | Source: Other Acute Inpatient Hospital | Attending: Plastic Surgery within the Head & Neck

## 2020-11-05 ENCOUNTER — Other Ambulatory Visit (HOSPITAL_BASED_OUTPATIENT_CLINIC_OR_DEPARTMENT_OTHER): Payer: Self-pay | Admitting: Plastic Surgery within the Head & Neck

## 2020-11-05 ENCOUNTER — Inpatient Hospital Stay (HOSPITAL_COMMUNITY): Payer: Medicare Other

## 2020-11-05 ENCOUNTER — Encounter (HOSPITAL_COMMUNITY): Payer: Self-pay | Admitting: Unknown Physician Specialty

## 2020-11-05 ENCOUNTER — Inpatient Hospital Stay (HOSPITAL_COMMUNITY): Payer: Medicare Other | Admitting: Unknown Physician Specialty

## 2020-11-05 ENCOUNTER — Other Ambulatory Visit (HOSPITAL_COMMUNITY): Payer: Medicare Other

## 2020-11-05 ENCOUNTER — Telehealth (HOSPITAL_COMMUNITY): Payer: Self-pay | Admitting: Critical Care Medicine

## 2020-11-05 ENCOUNTER — Inpatient Hospital Stay
Admission: AD | Admit: 2020-11-05 | Discharge: 2020-11-12 | DRG: 011 | Disposition: A | Discharge status: Home Health | Payer: Medicare Other | Source: Other Acute Inpatient Hospital | Attending: Plastic Surgery within the Head & Neck | Admitting: Plastic Surgery within the Head & Neck

## 2020-11-05 ENCOUNTER — Other Ambulatory Visit (HOSPITAL_COMMUNITY): Payer: Self-pay

## 2020-11-05 DIAGNOSIS — R52 Pain, unspecified: Secondary | ICD-10-CM

## 2020-11-05 DIAGNOSIS — R1312 Dysphagia, oropharyngeal phase: Secondary | ICD-10-CM

## 2020-11-05 DIAGNOSIS — E46 Unspecified protein-calorie malnutrition: Secondary | ICD-10-CM | POA: Diagnosis present

## 2020-11-05 DIAGNOSIS — J9600 Acute respiratory failure, unspecified whether with hypoxia or hypercapnia: Secondary | ICD-10-CM

## 2020-11-05 DIAGNOSIS — C140 Malignant neoplasm of pharynx, unspecified: Secondary | ICD-10-CM

## 2020-11-05 DIAGNOSIS — R1310 Dysphagia, unspecified: Secondary | ICD-10-CM | POA: Diagnosis present

## 2020-11-05 DIAGNOSIS — Z781 Physical restraint status: Secondary | ICD-10-CM

## 2020-11-05 DIAGNOSIS — K7031 Alcoholic cirrhosis of liver with ascites: Secondary | ICD-10-CM

## 2020-11-05 DIAGNOSIS — C329 Malignant neoplasm of larynx, unspecified: Principal | ICD-10-CM | POA: Diagnosis present

## 2020-11-05 DIAGNOSIS — K7030 Alcoholic cirrhosis of liver without ascites: Secondary | ICD-10-CM | POA: Diagnosis present

## 2020-11-05 DIAGNOSIS — Z7982 Long term (current) use of aspirin: Secondary | ICD-10-CM

## 2020-11-05 DIAGNOSIS — D696 Thrombocytopenia, unspecified: Secondary | ICD-10-CM | POA: Diagnosis present

## 2020-11-05 DIAGNOSIS — D099 Carcinoma in situ, unspecified: Secondary | ICD-10-CM

## 2020-11-05 DIAGNOSIS — I130 Hypertensive heart and chronic kidney disease with heart failure and stage 1 through stage 4 chronic kidney disease, or unspecified chronic kidney disease: Secondary | ICD-10-CM | POA: Diagnosis present

## 2020-11-05 DIAGNOSIS — Z930 Tracheostomy status: Secondary | ICD-10-CM

## 2020-11-05 DIAGNOSIS — I119 Hypertensive heart disease without heart failure: Secondary | ICD-10-CM

## 2020-11-05 DIAGNOSIS — Z720 Tobacco use: Secondary | ICD-10-CM

## 2020-11-05 DIAGNOSIS — I2129 ST elevation (STEMI) myocardial infarction involving other sites: Secondary | ICD-10-CM

## 2020-11-05 DIAGNOSIS — Z9911 Dependence on respirator [ventilator] status: Secondary | ICD-10-CM

## 2020-11-05 DIAGNOSIS — J9690 Respiratory failure, unspecified, unspecified whether with hypoxia or hypercapnia: Secondary | ICD-10-CM | POA: Diagnosis present

## 2020-11-05 DIAGNOSIS — K7460 Unspecified cirrhosis of liver: Secondary | ICD-10-CM

## 2020-11-05 DIAGNOSIS — R9431 Abnormal electrocardiogram [ECG] [EKG]: Secondary | ICD-10-CM

## 2020-11-05 DIAGNOSIS — Z881 Allergy status to other antibiotic agents status: Secondary | ICD-10-CM

## 2020-11-05 DIAGNOSIS — I2510 Atherosclerotic heart disease of native coronary artery without angina pectoris: Secondary | ICD-10-CM | POA: Diagnosis present

## 2020-11-05 DIAGNOSIS — C779 Secondary and unspecified malignant neoplasm of lymph node, unspecified: Secondary | ICD-10-CM | POA: Diagnosis present

## 2020-11-05 DIAGNOSIS — Z880 Allergy status to penicillin: Secondary | ICD-10-CM

## 2020-11-05 DIAGNOSIS — Z4682 Encounter for fitting and adjustment of non-vascular catheter: Secondary | ICD-10-CM

## 2020-11-05 DIAGNOSIS — Z634 Disappearance and death of family member: Secondary | ICD-10-CM

## 2020-11-05 DIAGNOSIS — K219 Gastro-esophageal reflux disease without esophagitis: Secondary | ICD-10-CM | POA: Diagnosis present

## 2020-11-05 DIAGNOSIS — I071 Rheumatic tricuspid insufficiency: Secondary | ICD-10-CM

## 2020-11-05 DIAGNOSIS — H02401 Unspecified ptosis of right eyelid: Secondary | ICD-10-CM | POA: Diagnosis present

## 2020-11-05 DIAGNOSIS — I252 Old myocardial infarction: Secondary | ICD-10-CM

## 2020-11-05 DIAGNOSIS — N189 Chronic kidney disease, unspecified: Secondary | ICD-10-CM | POA: Diagnosis present

## 2020-11-05 DIAGNOSIS — Z9889 Other specified postprocedural states: Secondary | ICD-10-CM

## 2020-11-05 DIAGNOSIS — Z923 Personal history of irradiation: Secondary | ICD-10-CM

## 2020-11-05 DIAGNOSIS — Z9221 Personal history of antineoplastic chemotherapy: Secondary | ICD-10-CM

## 2020-11-05 DIAGNOSIS — E785 Hyperlipidemia, unspecified: Secondary | ICD-10-CM | POA: Diagnosis present

## 2020-11-05 DIAGNOSIS — C7800 Secondary malignant neoplasm of unspecified lung: Secondary | ICD-10-CM | POA: Diagnosis present

## 2020-11-05 DIAGNOSIS — I5022 Chronic systolic (congestive) heart failure: Secondary | ICD-10-CM | POA: Diagnosis present

## 2020-11-05 DIAGNOSIS — Z681 Body mass index (BMI) 19 or less, adult: Secondary | ICD-10-CM

## 2020-11-05 DIAGNOSIS — R69 Illness, unspecified: Secondary | ICD-10-CM

## 2020-11-05 DIAGNOSIS — C319 Malignant neoplasm of accessory sinus, unspecified: Secondary | ICD-10-CM | POA: Diagnosis present

## 2020-11-05 DIAGNOSIS — E875 Hyperkalemia: Secondary | ICD-10-CM | POA: Diagnosis present

## 2020-11-05 DIAGNOSIS — Z20822 Contact with and (suspected) exposure to covid-19: Secondary | ICD-10-CM | POA: Diagnosis present

## 2020-11-05 DIAGNOSIS — K766 Portal hypertension: Secondary | ICD-10-CM | POA: Diagnosis present

## 2020-11-05 DIAGNOSIS — R001 Bradycardia, unspecified: Secondary | ICD-10-CM

## 2020-11-05 LAB — EKG 12 LEAD
Atrial Rate: 63 {beats}/min
Atrial Rate: 64 {beats}/min
P Axis: 104 degrees
P Axis: 106 degrees
P-R Interval: 140 ms
P-R Interval: 142 ms
Q-T Interval: 480 ms
Q-T Interval: 472 ms
QRS Duration: 100 ms
QRS Duration: 98 ms
QTC Calculation: 491 ms
QTC Calculation: 486 ms
R Axis: 95 degrees
R Axis: 95 degrees
T Axis: 78 degrees
T Axis: 80 degrees
Ventricular Rate: 63 {beats}/min
Ventricular Rate: 64 {beats}/min

## 2020-11-05 LAB — TRANSTHORACIC ECHO (TTE) COMPLETE
AoV max: 141.8 cm/s
E/E' ratio: 12.7
EF: 56.7 %
IVSd: 0.96 cm
LV Systolic Volume (BP): 24.9 ml
LVIDd: 4.4 cm
LVIDs: 3 cm
LVPWd: 0.89 cm
RVDd: 2.8 cm

## 2020-11-05 LAB — BASIC METABOLIC PANEL
Anion Gap: 6 (ref 4–12)
Calcium: 8 mg/dL — ABNORMAL LOW (ref 8.9–10.2)
Carbon Dioxide, Total: 27 meq/L (ref 22–32)
Chloride: 104 meq/L (ref 98–108)
Creatinine: 0.83 mg/dL (ref 0.51–1.18)
Glucose: 183 mg/dL — ABNORMAL HIGH (ref 62–125)
Potassium: 4.4 meq/L (ref 3.6–5.2)
Sodium: 137 meq/L (ref 135–145)
Urea Nitrogen: 27 mg/dL — ABNORMAL HIGH (ref 8–21)
eGFR by CKD-EPI 2021: >60 mL/min/{1.73_m2} (ref >59)

## 2020-11-05 LAB — COVID-19 CORONAVIRUS QUALITATIVE PCR: COVID-19 Coronavirus Qual PCR Result: NOT DETECTED

## 2020-11-05 LAB — GLUCOSE POC, ~~LOC~~
Glucose (POC): 103 mg/dL (ref 62–125)
Glucose (POC): 105 mg/dL (ref 62–125)
Glucose (POC): 125 mg/dL (ref 62–125)
Glucose (POC): 132 mg/dL — ABNORMAL HIGH (ref 62–125)
Glucose (POC): 149 mg/dL — ABNORMAL HIGH (ref 62–125)
Glucose (POC): 165 mg/dL — ABNORMAL HIGH (ref 62–125)
Glucose (POC): 178 mg/dL — ABNORMAL HIGH (ref 62–125)
Glucose (POC): 193 mg/dL — ABNORMAL HIGH (ref 62–125)
Glucose (POC): 204 mg/dL — ABNORMAL HIGH (ref 62–125)
Glucose (POC): 210 mg/dL — ABNORMAL HIGH (ref 62–125)
Glucose (POC): 210 mg/dL — ABNORMAL HIGH (ref 62–125)
Glucose (POC): 99 mg/dL (ref 62–125)

## 2020-11-05 LAB — CBC (HEMOGRAM)
Hematocrit: 33 % — ABNORMAL LOW (ref 38.0–50.0)
Hemoglobin: 10.4 g/dL — ABNORMAL LOW (ref 13.0–18.0)
MCH: 24.8 pg — ABNORMAL LOW (ref 27.3–33.6)
MCHC: 31.1 g/dL — ABNORMAL LOW (ref 32.2–36.5)
MCV: 80 fL — ABNORMAL LOW (ref 81–98)
Platelet Count: 60 10*3/uL — ABNORMAL LOW (ref 150–400)
RBC: 4.19 10*6/uL — ABNORMAL LOW (ref 4.40–5.60)
RDW-CV: 21.1 % — ABNORMAL HIGH (ref 11.6–14.4)
WBC: 6.25 10*3/uL (ref 4.3–10.0)

## 2020-11-05 LAB — PROTHROMBIN & PTT
Partial Thromboplastin Time: 27 s (ref 22–35)
Prothrombin INR: 1.4 — ABNORMAL HIGH (ref 0.8–1.3)
Prothrombin Time Patient: 16.9 s — ABNORMAL HIGH (ref 10.7–15.6)

## 2020-11-05 LAB — BLOOD GAS, VENOUS (NO ELECTROLYTES)
Base Excess, Blood, VEN: 2.3 meq/L (ref 0.0–3.0)
Bicarbonate, VEN: 28 meq/L — ABNORMAL HIGH (ref 23–27)
O2 Saturation, VEN: 84 % — ABNORMAL HIGH (ref 70–75)
pCO2, VEN: 47 mmHg (ref 42–50)
pH, VEN: 7.38 (ref 7.32–7.40)
pO2, VEN: 50 mmHg — ABNORMAL HIGH (ref 35–40)

## 2020-11-05 LAB — TROPONIN_I
Troponin_I Interpretation: ELEVATED
Troponin_I Interpretation: ELEVATED
Troponin_I: 0.09 ng/mL — ABNORMAL HIGH (ref <0.04)
Troponin_I: 0.1 ng/mL — ABNORMAL HIGH (ref <0.04)

## 2020-11-05 LAB — PHOSPHATE: Phosphate: 2.3 mg/dL — ABNORMAL LOW (ref 2.5–4.5)

## 2020-11-05 LAB — MAGNESIUM
Magnesium: 1.6 mg/dL — ABNORMAL LOW (ref 1.8–2.4)
Magnesium: 2.4 mg/dL (ref 1.8–2.4)

## 2020-11-05 SURGERY — TRACHEOSTOMY, OPEN
Anesthesia: General | Site: Neck | Wound class: Class II/ Clean Contaminated

## 2020-11-05 MED ORDER — SODIUM PHOSPHATES 45 MMOLE/15ML IV SOLN
40.0000 meq | INTRAVENOUS | Status: DC | PRN
Start: 2020-11-05 — End: 2020-11-06

## 2020-11-05 MED ORDER — GLUCAGON HCL RDNA (DIAGNOSTIC) 1 MG IJ SOLR
0.5000 mg | INTRAMUSCULAR | Status: DC | PRN
Start: 2020-11-05 — End: 2020-11-06

## 2020-11-05 MED ORDER — SUGAMMADEX SODIUM 200 MG/2ML IV SOLN
INTRAVENOUS | Status: DC | PRN
Start: 2020-11-05 — End: 2020-11-05
  Administered 2020-11-05: 200 mg via INTRAVENOUS

## 2020-11-05 MED ORDER — FENTANYL CITRATE (PF) 100 MCG/2ML IJ SOLN
25.0000 ug | INTRAMUSCULAR | Status: AC | PRN
Start: 2020-11-05 — End: 2020-11-05
  Filled 2020-11-05: qty 2

## 2020-11-05 MED ORDER — SODIUM CHLORIDE 0.9 % IV SOLN
40.0000 meq | Freq: Four times a day (QID) | INTRAVENOUS | Status: DC | PRN
Start: 2020-11-05 — End: 2020-11-06

## 2020-11-05 MED ORDER — DEXTROSE IN LACTATED RINGERS 5 % IV SOLN
25.0000 mL/h | INTRAVENOUS | Status: DC
Start: 2020-11-05 — End: 2020-11-07
  Administered 2020-11-05 – 2020-11-06 (×2): 50 mL/h via INTRAVENOUS

## 2020-11-05 MED ORDER — LACTULOSE 10 GM/15ML OR SOLN
20.0000 g | Freq: Every day | ORAL | Status: DC | PRN
Start: 2020-11-05 — End: 2020-11-12
  Filled 2020-11-05: qty 30

## 2020-11-05 MED ORDER — LACTATED RINGERS BOLUS
500.0000 mL | Freq: Once | INTRAVENOUS | Status: AC
Start: 2020-11-05 — End: 2020-11-05
  Administered 2020-11-05: 500 mL via INTRAVENOUS

## 2020-11-05 MED ORDER — ROCURONIUM BROMIDE 50 MG/5ML IV SOLN
INTRAVENOUS | Status: DC | PRN
Start: 2020-11-05 — End: 2020-11-05
  Administered 2020-11-05: 20 mg via INTRAVENOUS
  Administered 2020-11-05: 60 mg via INTRAVENOUS

## 2020-11-05 MED ORDER — SODIUM CHLORIDE (PF) 0.9 % IJ SOLN
1.0000 mL | Freq: Once | INTRAVENOUS | Status: DC | PRN
Start: 2020-11-05 — End: 2020-11-06

## 2020-11-05 MED ORDER — PROPOFOL 500 MG/50ML IV EMUL INFUSION
INTRAVENOUS | Status: AC
Start: 2020-11-05 — End: 2020-11-05
  Administered 2020-11-05: 25 ug/kg/min via INTRAVENOUS
  Filled 2020-11-05: qty 50

## 2020-11-05 MED ORDER — HEPARIN SODIUM (PORCINE) 5000 UNIT/ML IJ SOLN
5000.0000 [IU] | Freq: Three times a day (TID) | INTRAMUSCULAR | Status: DC
Start: 2020-11-05 — End: 2020-11-07
  Administered 2020-11-05 – 2020-11-07 (×5): 5000 [IU] via SUBCUTANEOUS
  Filled 2020-11-05 (×5): qty 1

## 2020-11-05 MED ORDER — SPIRONOLACTONE 100 MG OR TABS
100.0000 mg | ORAL_TABLET | Freq: Every day | ORAL | Status: DC
Start: 2020-11-05 — End: 2020-11-05
  Filled 2020-11-05: qty 1

## 2020-11-05 MED ORDER — GLUCAGON HCL RDNA (DIAGNOSTIC) 1 MG IJ SOLR
1.0000 mg | INTRAMUSCULAR | Status: DC | PRN
Start: 2020-11-05 — End: 2020-11-06

## 2020-11-05 MED ORDER — PROPOFOL BOLUS FROM PUMP
10.0000 mg | Status: DC | PRN
Start: 2020-11-05 — End: 2020-11-06

## 2020-11-05 MED ORDER — LIDOCAINE-EPINEPHRINE 1 %-1:100000 IJ SOLN
INTRAMUSCULAR | Status: DC | PRN
Start: 2020-11-05 — End: 2020-11-05
  Administered 2020-11-05: 5 mL via INTRAMUSCULAR

## 2020-11-05 MED ORDER — LACTATED RINGERS BOLUS
500.0000 mL | Freq: Once | INTRAVENOUS | Status: DC
Start: 2020-11-05 — End: 2020-11-05

## 2020-11-05 MED ORDER — PROPOFOL 10 MG/ML IV EMUL WRAPPER (OSM ONLY)
INTRAVENOUS | Status: DC | PRN
Start: 2020-11-05 — End: 2020-11-05
  Administered 2020-11-05: 100 mg via INTRAVENOUS

## 2020-11-05 MED ORDER — ASPIRIN 81 MG OR CHEW
81.0000 mg | CHEWABLE_TABLET | Freq: Every day | ORAL | Status: DC
Start: 2020-11-05 — End: 2020-11-06
  Administered 2020-11-05 – 2020-11-06 (×2): 81 mg via GASTROSTOMY
  Filled 2020-11-05 (×2): qty 1

## 2020-11-05 MED ORDER — INSULIN REGULAR 100 UNITS IN NS 100 ML INFUSION (PKG PREMIX)
0.0000 [IU]/h | INJECTION | Status: DC
Start: 2020-11-05 — End: 2020-11-06
  Administered 2020-11-05: 2 [IU]/h via INTRAVENOUS
  Filled 2020-11-05: qty 100

## 2020-11-05 MED ORDER — SODIUM CHLORIDE 0.9 % IV SOLN
40.0000 meq | INTRAVENOUS | Status: DC | PRN
Start: 2020-11-05 — End: 2020-11-06

## 2020-11-05 MED ORDER — POTASSIUM CHLORIDE CRYS ER 20 MEQ OR TBCR
20.0000 meq | EXTENDED_RELEASE_TABLET | Freq: Four times a day (QID) | ORAL | Status: DC | PRN
Start: 2020-11-05 — End: 2020-11-07

## 2020-11-05 MED ORDER — MAGNESIUM SULFATE 2 GM/50ML SWFI IV SOLN
2.0000 g | Freq: Four times a day (QID) | INTRAVENOUS | Status: DC | PRN
Start: 2020-11-05 — End: 2020-11-06
  Administered 2020-11-05: 2 g via INTRAVENOUS
  Filled 2020-11-05: qty 50

## 2020-11-05 MED ORDER — SODIUM CHLORIDE 0.9 % IV SOLN
20.0000 meq | Freq: Four times a day (QID) | INTRAVENOUS | Status: DC | PRN
Start: 2020-11-05 — End: 2020-11-06

## 2020-11-05 MED ORDER — POTASSIUM CHLORIDE CRYS ER 20 MEQ OR TBCR
40.0000 meq | EXTENDED_RELEASE_TABLET | Freq: Four times a day (QID) | ORAL | Status: DC | PRN
Start: 2020-11-05 — End: 2020-11-07

## 2020-11-05 MED ORDER — CEFTRIAXONE SODIUM IN DEXTROSE 40 MG/ML IV SOLN
2.0000 g | INTRAVENOUS | Status: DC
Start: 2020-11-05 — End: 2020-11-06
  Administered 2020-11-05 – 2020-11-06 (×2): 2 g via INTRAVENOUS
  Filled 2020-11-05 (×2): qty 50

## 2020-11-05 MED ORDER — CALCIUM GLUCONATE-NACL 2-0.675 GM/100ML-% IV SOLN
2.0000 g | Freq: Four times a day (QID) | INTRAVENOUS | Status: DC | PRN
Start: 2020-11-05 — End: 2020-11-06

## 2020-11-05 MED ORDER — ATORVASTATIN CALCIUM 40 MG OR TABS
40.0000 mg | ORAL_TABLET | Freq: Every day | ORAL | Status: DC
Start: 2020-11-05 — End: 2020-11-12
  Administered 2020-11-05 – 2020-11-12 (×8): 40 mg via NASOGASTRIC
  Filled 2020-11-05 (×8): qty 1

## 2020-11-05 MED ORDER — EPHEDRINE SULFATE-NACL 25-0.9 MG/5ML-% IV SOSY
PREFILLED_SYRINGE | INTRAVENOUS | Status: DC | PRN
Start: 2020-11-05 — End: 2020-11-05
  Administered 2020-11-05: 5 mg via INTRAVENOUS

## 2020-11-05 MED ORDER — DEXTROSE 10% IV SOLUTION BOLUS
125.0000 mL | Status: DC | PRN
Start: 2020-11-05 — End: 2020-11-06

## 2020-11-05 MED ORDER — PANTOPRAZOLE SODIUM 40 MG IV SOLR
40.0000 mg | Freq: Every day | INTRAVENOUS | Status: DC
Start: 2020-11-05 — End: 2020-11-06
  Administered 2020-11-05 – 2020-11-06 (×2): 40 mg via INTRAVENOUS
  Filled 2020-11-05 (×2): qty 10

## 2020-11-05 MED ORDER — FUROSEMIDE 20 MG OR TABS
40.0000 mg | ORAL_TABLET | Freq: Every day | ORAL | Status: DC
Start: 2020-11-05 — End: 2020-11-05

## 2020-11-05 MED ORDER — OMEPRAZOLE 20 MG OR CPDR
20.0000 mg | DELAYED_RELEASE_CAPSULE | Freq: Every day | ORAL | Status: DC
Start: 2020-11-05 — End: 2020-11-05

## 2020-11-05 MED ORDER — FENTANYL CITRATE (PF) 100 MCG/2ML IJ SOLN
12.5000 ug | INTRAMUSCULAR | Status: DC | PRN
Start: 2020-11-05 — End: 2020-11-06

## 2020-11-05 MED ORDER — CARVEDILOL 6.25 MG OR TABS
6.2500 mg | ORAL_TABLET | Freq: Two times a day (BID) | ORAL | Status: DC
Start: 2020-11-05 — End: 2020-11-05
  Filled 2020-11-05: qty 1

## 2020-11-05 MED ORDER — ROCURONIUM BROMIDE 50 MG/5ML IV SOLN
INTRAVENOUS | Status: AC
Start: 2020-11-05 — End: ?
  Filled 2020-11-05: qty 10

## 2020-11-05 MED ORDER — CLINDAMYCIN PHOSPHATE IN NACL 900-0.9 MG/50ML-% IV SOLN
INTRAVENOUS | Status: DC | PRN
Start: 2020-11-05 — End: 2020-11-05
  Administered 2020-11-05: 900 mg via INTRAVENOUS

## 2020-11-05 MED ORDER — FAMOTIDINE (PF) 20 MG/2ML IV SOLN
20.0000 mg | Freq: Two times a day (BID) | INTRAVENOUS | Status: DC
Start: 2020-11-05 — End: 2020-11-05

## 2020-11-05 MED ORDER — FENTANYL CITRATE (PF) 100 MCG/2ML IJ SOLN
12.5000 ug | INTRAMUSCULAR | Status: DC | PRN
Start: 2020-11-05 — End: 2020-11-06
  Administered 2020-11-05 (×5): 12.5 ug via INTRAVENOUS
  Filled 2020-11-05: qty 2

## 2020-11-05 MED ORDER — SODIUM CHLORIDE (PF) 0.9 % IJ SOLN
1.0000 mL | Freq: Once | INTRAVENOUS | Status: DC | PRN
Start: 2020-11-05 — End: 2020-11-05

## 2020-11-05 MED ORDER — PROPOFOL 500 MG/50ML IV EMUL INFUSION
0.0000 ug/kg/min | INTRAVENOUS | Status: DC
Start: 2020-11-05 — End: 2020-11-06
  Administered 2020-11-05: 10 ug/kg/min via INTRAVENOUS
  Administered 2020-11-05: 30 ug/kg/min via INTRAVENOUS
  Administered 2020-11-05: 45 ug/kg/min via INTRAVENOUS
  Administered 2020-11-05: 40 ug/kg/min via INTRAVENOUS
  Filled 2020-11-05 (×2): qty 50

## 2020-11-05 SURGICAL SUPPLY — 21 items
CLIP HORIZON MEDIUM 6EA/PK (Laparoscopic) ×2 IMPLANT
CLIP HORIZON TI SM RED LIGATE 24EA/PK (Laparoscopic) ×2 IMPLANT
ELECTRODE PATIENT RETURN POLYHESVIE II REM W/ CORD (Other) ×1 IMPLANT
GLOVE SURG 8 BIOGEL MICRO INDICATOR PF (Glove) ×2 IMPLANT
GLOVE SURG BIOGEL 7 1/2 ULTRATOUCH PF (Glove) ×2 IMPLANT
HOLD TUBE SHILEY WIDE BAND (Other) ×2 IMPLANT
LUBRICANT PETROLEUM 2OZ WATER SOLUBLE STERILE (Other) ×2 IMPLANT
NEEDLE HYPODERMIC SAFETY 25GA 1-1/2IN MAGELLAN (Needle) ×2 IMPLANT
NEEDLE PLASTIC BLUNT (Needle) ×2 IMPLANT
PACK CUSTOM HEAD NECK (Pack) ×2 IMPLANT
PACK GOWN 4 PACK W/TOWELS (Gown) ×2 IMPLANT
PAD GROUNDING W/9FT CORD POLYHESIVE II (Other) ×1
PENCIL SMOKE EVACUATION NEPTUNE 70MM W/10IN TUBING AND CORD ×2 IMPLANT
SUTURE POLYSORB 3-0 V-20 18IN UNDYED (Suture) ×2 IMPLANT
SUTURE SOFSILK 2-0 V-20 18IN BLACK (Suture) ×2 IMPLANT
SUTURE SURGIPRO II 2-0 C-17 30IN BLUE (Suture) ×2 IMPLANT
SYRINGE BD 10ML CONTROL (Syringe) ×2 IMPLANT
TOWEL OR DISPOSABLE STERILE BLUE 6/PK (Towel) ×2 IMPLANT
TUBE TRACH 6 6.4MM CUFF 10.8MM SHILEY LPC (Other) ×2
TUBE TRACH 6 6.4MM CUFF 10.8MM SHILEY LPC MFR DISCONTINUED (Other) ×1 IMPLANT
TUBE TRACH 8 7.6MM CUFF 12.2MM SHILEY LPC (Other) IMPLANT

## 2020-11-05 NOTE — Progress Notes (Signed)
11/05/20 1717   Vent Information   Vent ID Servo [id number]   O2 Delivery Method Ventilator   Vent Mode VC/AC   Ventilator On Yes   Vent System Check Yes   Settings   SpO2 100 %   FiO2 (%) 30 %   Resp Rate (Set) 16   Vt (Set, mL) 480 mL   PEEP/CPAP (cm H2O) 5 cm H20   Insp Time (sec) 0.85 sec   I:E Ratio 1:3.4   Insp Flow (L/min) 48 L/min   Waveform Decelerating ramp   Trigger Sensitivity Flow (L/min) 3 L/min   Humidification Heat and moisture exchanger   Rise Time 0.15 cmH2O   Readings   Resp Rate Observed 16   PIP Observed (cm H2O) 18 cm H2O   Plateau Pressure (cm H2O) 10 cm H2O   Minute Ventilation (L/min) 7.8 L/min   MAP (cm H2O) 8   Alarms   Insp Pressure High (cm H2O) 60 cm H2O   MV High (L/min) 20 L/min   MV Low (L/min) 5 L/min   Daily Screen   ROX Index 20.83   Weaning Parameters   Resp 16   IHI Ventilator Associated Pneumonia Bundle   Head of Bed Elevated HOB 30   Surgical Airway 11/05/20 Shiley 6   Placement Date/Time: 11/05/20 1718   Surgical Airway Type: Tracheostomy  Brand: Shiley  Style: Cuffed  Size (mm): 6   Cuff Pressure (cm H2O) 28 cm H2O   Cuff Status Cuff Inflated   Site Assessment Bleeding;Sutured   Site Care Cleansed;Dressing applied   Inner Cannula Care   (patent w vent)   Ties Assessment Dry;Secure     Pt went to OR for tracheotomy, currently on vent with above settings. BS clear/diminishes. No skin breakdown. Ambu and spare trach at bedside. Continue to monitor.

## 2020-11-05 NOTE — Nursing Note (Signed)
Patient Summary  65 yo M w/ multiple throat/Neck masses     Med Hx: CAD, CHF, CKD, cirrhosis, HTN, HLD, MI w/ stents    7/12: Admitted to SICU 0600 from OSH d/t recurrent tonsillar CA. Pt experiencing SOB/Laryngeal mass obstructing airway. Intubated in the OR for airway protection at OSH.Transferred to [facility] for further OTO surgery/management.     0700- Brady down to 20's, asystole, Code called but pulse present after 2 mins.   -EKG and TTE done    1530- ETT advanced & down to OR for Trach. 2200 - Extubated to Aerosolized trach collar 30%    Illness Severity  Stable  1900 - 2300  Pt passed SBT. Extubated to Aerosolized trach mask 30% SpO2 99% VSS. RASS -1, responding appropriately and following commands. Restraints removed. BS checks Q2H on Algo 3 stable.     Next shifts shall watch for nicotine and ETOH withdrawal, restarting diuretics, long-term access (PICC?), restarting nicotine patch.

## 2020-11-05 NOTE — Anesthesia Postprocedure Evaluation (Signed)
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        Patient participation: patient cannot participate; patient unconscious (sedation)    Level of consciousness: unconscious due to IV sedation        Airway patency: patent    Cardiovascular status during assessment: stable    Respiratory status during assessment: mechanically ventilated    Anesthetic complications: no    Intravascular volume status assessment: euvolemic    Nausea / vomiting: patient is not experiencing nausea      Planned post-operative disposition at time of assessment: ICU care    Additional comments: Uncomplicated handoff to ICU team

## 2020-11-05 NOTE — Nursing Note (Signed)
Patient Summary  65 yo M w/ multiple throat/Neck masses     Med Hx: CAD, CHF, CKD, cirrhosis, HTN, HLD.     7/12: Admitted to SICU 0600 from OSH d/t recurrent tonsillar CA. Pt experiencing SOB. Had to get intubated in the OR for airway protection. Transferred to [facility] for further OTO surgery/management     Illness Severity  Watcher     Received patient sedated and intubated from [facility]. Patient followed commands and interacted with gestures. Denied pain. Calm and comfortable. Left handed. Able to make needs know with assistance.     Sedation medications switched from 25 Fentanyl and 1 versed to 45 prop. Admitted with BL PIV and a Foley. Coccygeal Blanchable erythema noted upon admission. Mepilex border placed and noted on LDA.    Pt was adjusted for a chest/abdomen x-ray. Just as the board was pulled out, the patient HR went brady down to the 20s. No femoral or radial pulse felt initially. The nurse pressed the Staff assist button for additional assistance. No carotid pulse felt initially.  Internal unit CODE blue called. Propofol stopped. Pt prepped for CPR. Pt returned HR to 60s spontaneously before compressions. ECG done.

## 2020-11-05 NOTE — Progress Notes (Signed)
11/05/20 0549   Vent Information   Ventilation Day(s) 1   Vent ID Servo [id number]   O2 Delivery Method Ventilator   Vent Mode VC/AC   Ventilator On Yes   $Vent Charge Initial (ONE TIME ONLY) Yes   Vent System Check Yes   Settings   SpO2 100 %   FiO2 (%) 30 %   Resp Rate (Set) 16   Vt (Set, mL) 480 mL   PEEP/CPAP (cm H2O) 5 cm H20   Insp Time (sec) 0.85 sec   I:E Ratio 1:3.4   Insp Flow (L/min) 48 L/min   Waveform Decelerating ramp   Trigger Sensitivity Flow (L/min) 3 L/min   Humidification Heat and moisture exchanger   Rise Time 0.15 cmH2O   Readings   PEEP Observed (cm H2O) 5 cm H2O   Delta P (cm H2O) 21   Driving Pressure (cm H2O) 15   Resp Rate Observed 22   Vt (observed, mL) 481 mL   PIP Observed (cm H2O) 26 cm H2O   Plateau Pressure (cm H2O) 20 cm H2O   Minute Ventilation (L/min) 8.9 L/min   MAP (cm H2O) 8   Alarms   Insp Pressure High (cm H2O) 60 cm H2O   MV High (L/min) 20 L/min   MV Low (L/min) 5 L/min   Apnea Interval (sec) 20 seconds   Daily Screen   ROX Index 15.15   Weaning Parameters   Resp (!) 22   IHI Ventilator Associated Pneumonia Bundle   Head of Bed Elevated HOB 30   Oral Care Mouth suctioned   ETT 11/05/20 ETT - single Oral   Placement Date/Time: 11/05/20 0549   ETT Type: ETT - single  Single Lumen Tube Size: 6 mm  Location: Oral  Comments: (c)    Cuff Leak Yes   Cuff Pressure (cm H2O) 28 cm H2O   Secured at (cm) 28 cm   Measured from Teeth/Gums   [employer]   Secured by Charity fundraiser  (Anchor fast)   Site Condition Dry;Cool   Patient arrived intubated with EMS to [id number].  Has a secured #[id number] at 28cm teeth. Bilateral chest rise.  Per phone report, this is a short ETT in length. Awaiting chest X-ray.  Currently on settings passed by EMS and Team notified. Will continue to monitor.

## 2020-11-05 NOTE — Brief Op Note (Addendum)
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    FINDINGS    6.0 cuffed trach was sutured in place with 5 sutures    POST OP PLAN     Fresh Oto Trach Protocol:   - Please do not cut or manipulate flange sutures OR trach ties    - If there is any concern with trach ties please notify Otolaryngology first before manipulating them.  - Ok for gentle trach care (inline suction)  - Please confirm sign above bed that reads: Fresh Oto Trach Protocol   - In the event of accidental decannulation:     Difficult airway - While able to be orally intubated at OSH, the size of his mass will likely make another oral intubation challenging. If accidental decannulation, page Oto STAT to bedside and attempt replacement of tracheostomy tube. If this fails, try all measures including ETT into trachea  - Oto will perform 1st trach change in 5-7 days  - Ok for RT to manage cuff inflation after POD#1   - Please NEVER cap a cuffed trach, even if deflated   - Please have spare / backup 6.0 cuffed DCT (same size) trach and emergency 4.0 cuffed DCT (smaller size) trach at bedside     - Okay to wean off vent as able     Please page Oto-HNS w any questions or concerns.

## 2020-11-05 NOTE — Anesthesia Preprocedure Evaluation (Signed)
Patient: Cody Clark    Procedure Information     Anesthesia Start Date/Time: 11/05/20 1540    Procedure: OPEN TRACHEOSTOMY size 6 (N/A Neck)    Location: [place] [facility] OR 06 / [place] [facility] OR    Surgeons: Theadore Nan, MD        HPI: 65 yo with hx of head/neck CA s/p chemo rads, intubated for difficulty breathing at OSH, apparently difficult airway due to cancer recurrence. Evidence of ongoing demand ischemia vs potentially evolving NSTEMI. Plan is trach.    Relevant Problems   Cardio   (+) Coronary artery disease due to calcified coronary lesion      GI/Hepatic/Renal   (+) Alcoholic cirrhosis of liver with ascites (HCC)      Oncology   (+) Squamous cell carcinoma of larynx [facility])     Relevant surgical history:       Medications:     Outpatient:   Current Outpatient Medications   Medication Instructions   . aspirin 81 mg, Oral, Daily   . atorvastatin (LIPITOR) 40 mg, Oral, Daily   . carVEDilol (COREG) 6.25 mg, Oral, 2 times daily   . cefdinir (OMNICEF) 300 mg, Oral, Every 12 hours scheduled   . furosemide 40 MG tablet Oral   . lactulose 20 g, Oral, Daily PRN   . melatonin 3 mg, Oral, Nightly PRN   . nicotine 14 MG/24HR patch 1 patch, Transdermal, Daily   . omeprazole (PRILOSEC) 20 mg, Oral, Daily empty stomach   . oxyCODONE 5 mg, Oral, Every 8 hours PRN   . spironolactone (ALDACTONE) 100 mg, Oral, Daily        Inpatient:   Scheduled   .  aspirin, 81 mg, Daily  .  atorvastatin, 40 mg, Daily  .  cefTRIAXone, 2 g, q24h  .  heparin, 5,000 units, q8h [facility]  .  pantoprazole, 40 mg, Daily  .  propofol, ,       Continuous  dextrose 5 % and lactated ringers, 50 mL/hr, Continuous, Last Rate: 50 mL/hr (11/05/20 1540)  .  insulin REGULAR, 0-19 Units/hr, Continuous, Last Rate: 3 Units/hr (11/05/20 1540)  .  propofol, 0-80 mcg/kg/min (Order-Specific), Continuous, Last Rate: 100 mcg/kg/min (11/05/20 1541)      PRN  calcium gluconate, 2 g, q6h PRN  .  dextrose, 125 mL, PRN  .  fentaNYL PF, 12.5-25 mcg, q30 min PRN  .  fentaNYL PF,  12.5-25 mcg, q5 min PRN  .  glucagon, 0.5 mg, PRN  .  glucagon, 1 mg, PRN  .  lactulose, 20 g, Daily PRN  .  magnesium sulfate, 2 g, q6h PRN  .  perflutren lipid microsphere (Definity) in NS injection, 1-10 mL, Once PRN  .  potassium chloride, 20 mEq, q6h PRN  .  potassium chloride, 40 mEq, q6h PRN  .  potassium chloride ER, 20 mEq, q6h PRN  .  potassium chloride ER, 40 mEq, q6h PRN  .  potassium phosphate, 40 mEq, PRN  .  propofol, 10-50 mg, PRN  .  sodium phosphate, 40 mEq, PRN          Review of patient's allergies indicates:  Allergies   Allergen Reactions   . Apap [Acetaminophen] Unknown     Reported from OSH   . Pcn [Penicillins] Unknown     Reported from OSH     . Vancomycin Unknown     Reported from OSH       {Quick Link  Update/review allergies :9999}  Social History:     ROS/MED HX        Physical Exam  Airway  Unable to Assess      Dental    Cardiovascular  normal    Rhythm:  Regular  Rate:  Normal    Pulmonary  normal             Labs: (last year)    BMP  CBC/Coags   Na 137 11/05/2020  Hb 10.4 (L) 11/05/2020   K 4.4 11/05/2020  HCT 33 (L) 11/05/2020   Cl 104 11/05/2020  WBC 6.25 11/05/2020   HCO3 27 11/05/2020  PLT 60 (L) 11/05/2020   BUN 27 (H) 11/05/2020  INR 1.4 (H) 11/05/2020   Cr 0.83 11/05/2020  PT 16.9 (H) 11/05/2020   Glu 149 (H) 11/05/2020  PTT 27 11/05/2020       Misc   eGFR >60 11/05/2020  MCV 80 (L) 11/05/2020   A1C - -  BNP - -       LFTs   AST 28 01/15/2020  Albumin 3.2 (L) 01/15/2020   ALT 17 01/15/2020  Protein 7.8 01/15/2020   Alk Phos 130 01/15/2020  T Bili 0.9 01/15/2020      VBG    11/05/2020   pH PvCO2 PvO2 HCO3 Lactate   7.38 47 50 (H) 28 (H) -         Relevant procedures / diagnostic studies:     PAT CLINIC DISCUSSION    ANESTHESIA PLAN   ASA Score:     ASA: 4  Emergent    Planned Anesthetic Type:      general  Supervising Provider Comments:      Case reviewed with Dr. Bubba Hales. Given potential instability of airway, merits urgent trach.      Risk Calculators / Scores:     PONV: Low Risk  Total Score: 1             Intended opioid administration        Criteria that do not apply:    Male patient    Non-smoker    History of PONV    History of motion sickness

## 2020-11-05 NOTE — Nursing Note (Signed)
Patient Summary  65 yo M w/ multiple throat/Neck masses     Med Hx: CAD, CHF, CKD, cirrhosis, HTN, HLD, MI w/ stents      Illness Severity  Watcher     Patient had near code event at 72, brady down to 20 w/ asystole. Code called but pulse present approximately 2 mins later. EKG done, Mg replaced and Trop drawn. Patient's trop elevated at 0.09 up trended to 0.10 this afternoon. Patient's heart rate labile, Sinus Brady in the 40's-50's, intermittently mildly tachy in the low 100's w/ freq bigeminy. TTE done. BP stable t/o w/ MAPs 60's-70's.   ETT in place w/ cuff leak, tolerating vent. Able to lighten up sedation, Pt. A/OX4, writing to communicate. Consented for [employer]. ETT advanced w/ OTO & anesthesia at 1530 and Patient down to OR at 1555 for Trach. Back from OR at 1705, #6 Trach in place. Propofol weaned down. Plan for SBT once more awake and extubated to trach collar.     Plan for Next shift:   Continue to wean Propofol, SBT once awake. Plan to transition to trach collar when able. Trach care as needed.

## 2020-11-05 NOTE — H&P (Signed)
History and Physical - Critical Care     Cody Clark Cody Clark") - DOB: Jul 21, 1955 (65 year old male)  Preferred Pronouns: patient's name  PCP: Cody Spar, MD   Code Status: Full Code       CHIEF CONCERN / IDENTIFICATION:  Cody Clark is a 65 year old male with h/o CAD, CKD, cirrhosis, HTN, hyperlipidemia and recurrent tonsillar cancer s/p intubation for airway protection. Awaiting surgery/managemnt with Cody Clark.        SUBJECTIVE   HISTORY OF PRESENT ILLNESS:   Per ED note on 11/04/20 in care everywhere   Cody Clark is a 65 y.o. male who complains of difficulty breathing. This consultation was requested by Dr Cody Clark.    Cody Clark is a 65 y/o M with a history of tonsillar cancer. Initially diagnosed in 2011 and underwent surgical resection and chemoradiation. recently found to have recurrent disease at the level of the glottis. CT findings reveal involvement at the level of the larynx with encasement of the thyroid cartilege and crycothyroid membrane. He presented today via EMS for increased work of breathing at home. He recently started radiation treatment and was planning to undergo chemotherapy in the short term per Oncology documentation.     He is able to nod yes and shake head no to commands and expresses good insight and understanding.     The following portions of the patient's history were reviewed and updated as appropriate: allergies, current medications, past family history, past medical history, past social history, past surgical history and problem list.    He was intubated via awake fiberoptic at OSH and transferred to Cody Clark for surgical management by Cody Clark.      Review of Systems  Patient intubated       HISTORY   Problem List   Diagnosis   . Alcoholic cirrhosis of liver with ascites (Cody Clark)   . Chronic systolic heart failure (Cody Clark)   . Coronary artery disease due to calcified coronary lesion   . Squamous cell carcinoma in situ       No past surgical history on file.         No  family history on file.       OUTPATIENT MEDICATIONS:   No current outpatient medications    ALLERGIES:   Patient has no allergy information on record.        OBJECTIVE     Vitals (Arrival)      T: (not recorded)  BP: 123/68 (11/05/20 0542)  HR: (!) 57 (11/05/20 0542)  RR: 16 (11/05/20 0542)  SpO2: 100 % (11/05/20 0549) Ventilator   30 %   Vitals (Most recent in last 24 hrs)   T: (not recorded)  BP: 123/68 (11/05/20 0542)  HR: (!) 57 (11/05/20 0542)  RR: (!) 22 (11/05/20 0549)  SpO2: 100 % (11/05/20 0549) Ventilator   30 %  T range: No data recorded  Wt 78 lb 7.7 oz (35.6 kg)     Ht 5' 7.402" (1.712 m)     Body mass index is 12.15 kg/m.           Physical Exam  Constitutional:       General: He is not in acute distress.     Appearance: He is ill-appearing. He is not toxic-appearing.      Interventions: He is sedated and intubated.   HENT:      Head: Normocephalic and atraumatic.      Right Ear: External ear normal.  Left Ear: External ear normal.   Eyes:      Extraocular Movements: Extraocular movements intact.   Cardiovascular:      Rate and Rhythm: Normal rate and regular rhythm.      Pulses: Normal pulses.   Pulmonary:      Effort: Pulmonary effort is normal. He is intubated.      Breath sounds: Normal breath sounds.   Abdominal:      Comments: Peri umbilical hernia, reducible.  Abdomen is tense but non tender   Genitourinary:     Comments: Foley in place  Neurological:      Comments: Follows commands           Labs (last 24 hours):   Chemistries  CBC  LFT  Gases, other   - - - -   -   AST: - ALT: -  -/-/-/-  -/-/-/-   - - -   - >< -  AP: - T bili: -  Lact (a): - Lact (v): -   eGFR: - Ca: -   -   Prot: - Alb: -  Trop I: - D-dimer: -   Mg: - PO4: -  ANC: -     BNP: - Anti-Xa: -     ALC: -    INR: -        Data Review:      Reviewed Results? Independently visualized & interpreted? Key Findings     Lab [x]  []     Radiology []  []     EKG/Tele/Echo  []  []     Other?  []  []           ASSESSMENT/PLAN      Cody Clark is a 65 year old male with h/o CAD, CKD, cirrhosis, HTN, hyperlipidemia and recurrent tonsillar cancer s/p intubation for airway protection. Awaiting surgery/managemnt with Cody Clark.     #Need for mechanical ventilation  Intubated for airway protection.   - Propofol for sedation  - Fentanyl for analgesia     #recurrent tonsillar SCC  #airway compromise  Cody Clark managing. 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 He stopped drinking after the diagnosis for 10 years. Started drinking again after his wife died. He stopped drinking again in 20  - Furosemide 21m15mily per NG  - Spironolactone 100mg83mly per NG  - Lactulose 20 grams prn per NG     #R acromioclavicular septic arthritis   # Group B Strep Septicemia   S/p hospitalization for 55 days for IV abx in March 2022.  - Oral Cefdinir 300mg 45m   #GERD  - Omeprazole 20mg d34m per NG      ICU Checklist:    COVID-19 surveillance: Pending  Delirium prevention/therapy: per protocol  Fluids/electrolytes: Electrolytes per protocol  Diet: NPO diet NPO except for medications  Minimum mobility goal: bedrest (intubated)  DVT Prophylaxis: SCDs while awaiting Cody Clark plan  GI Prophylaxis: PPI  Glucose control: NI  Lines/Drains/Airways: ETT, foley, PIV, NG tube  Labs: CBC, BMP, Mag, phos daily  Antibiotic stewardship: Cefdinir (hold)  Disposition: Cody Clark  Code Status: Full Code    Interim Summary Due Date: 11/12/20  Contacts: Primary Emergency Contact: Cody Clark, Home  Phone: [phone]  Next of Kin: Cody Clark

## 2020-11-05 NOTE — Telephone Encounter (Signed)
Outside Hospital Transfer to ICU        The patient is currently at [facility] and I spoke with Dr. Augustin Coupe via the Transfer Center at ~2am.     REASON FOR TRANSFER REQUEST:   Management of laryngeal mass    HPI:   65 y.o. M with history of SCC of L tonsilar s/p resection/chemoradiation in 2012 now with recurrence of disease. He was pending XRT though missed first appt yesterday and presented to OSH with progressive dyspnea and increased WOB. He was bridged with BPAP and ultimately taken to OR with surgery and anesthesia where he had successful awake fiberoptic intubation with 6.5 ETT. Given no immediate ICU bed available near OSH, he was accepted by Dr Myna Bright of [facility] for transfer to [facility] for further care.    Prior to intubation at OSH, he received decadron 20 mg w/o avail. Otherwise, he has only received fluid and sedation.     COVID negative    CRITICAL CARE INTERVENTIONS:   Level of respiratory support: mechanical ventilation - TV500, FiO2 35%, PEEP +10  Level of hemodynamic support: none  Vascular access: PIV x2      OTHER RELEVANT LABS/STUDIES:   CBC wnl  BMP - BUN 30, Cr 1.0  Trop 0.04 without chest pain prior to sedation    PLAN: [x  ] The patient has been accepted, and has an estimated arrival time of: TBD   [_] I have declined transfer because: _    CURRENT LOCATION  [ ]  Med/surg acute floor        [x ]ICU       [ ]  Other: ________    Danise Mina SUB-SPECIALTY SERVICE(S) HAVE BEEN CONTACTED AND CC'D:    [_] not necessary   [_] yes - _, name of provider: _    TRANSFER BACK:   The referring provider has verbally agreed to accept the patient in transfer back to his or her facility when the patient's tertiary care needs have been addressed:              [_] yes      [_] no      [x]  This was not discussed     If the patient does not arrive with the necessary medical records, please notify Health Information Management at [phone] to help you obtain them. To coordinate care, I have cc'd this note  to the subspecialist(s).

## 2020-11-05 NOTE — Consults (Signed)
OTOLARYNGOLOGY CONSULT NOTE    PCP: Lindell Spar, MD    Chief Complaint: resp failure    History of the Present Illness:  Transferred to [facility] for management of respiratory failure requiring intubation. Patient currently intubated and sedated. 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    Per 10/21/20 Oncology Note from Bruce C. Kathrine Cords, MD:  The patient is a 65 year old gentleman from [place], referred for evaluation and management of recurrent head and neck cancer.    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 The largest lymph node measured 4.5 cm.    He underwent up-front surgery at [facility] and had no evidence of distant metastases. The surgical margin at the tonsillar bed was positive, and because of these high-risk features, he received concomitant adjuvantchemoradiation with weekly cisplatin at 30 mg/m2 along with radiotherapy that he completed on 01/31/2010.    Despite his history of alcoholic liver cirrhosis, which he had quit and regained a significant amount of liver function, he did quite well. Unfortunately, he is again drinking.    Posttreatment CT scanning of soft tissue neck with contrast on 02/16/2011 showed no significant abnormalities.    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    He was clinically stable until January 2022, when he developed sore throat on the right side, with dysphagia and hoarseness. He was seen by Dr. Lewie Loron, ENT, in [place]. Endoscopy on 09/24/2020 described a worrisome lesion throughout the right piriform sinus with erythema and edema extending up to involve the right hemilarynx with immobilization of the right cord. The left true  cord were mobile. He suspected there may have been some metastatic adenopathy in the right upper neck with perhaps some palpable abnormalities in the left upper neck.    CT neck with contrast at [facility] in [place] on 09/27/2020 showed an an infiltrative right glottic mass involving infraglottic and supraglottic soft tissues, measuring 4.6 x 4.2 x 4.9 cm, with extension into the right piriform sinus. There was effacement of the right cord with infiltration of the right arytenoid cartilage and right thyroid cartilage. There was extension to the anterior commissure. The upper margin of the mass was contiguous with the posterior superior margin of the right hyoid bone along the margin appear from piriform sinus. Inferior extension extended into the superior margin of the thyroid gland and along the posterior lateral margin of the right cricoid. No definite neck lymphadenopathy.    CT chest with contrast on 09/27/2020 showed multiple variably sized pulmonary metastase. Also noted was liver cirrhosis and portal hypertension.    Laryngeal biopsy in [facility] 10/15/2020 showed p16 negative squamous cell carcinoma.      Past Medical History:  No past medical history on file.   Per 10/21/20 Oncology Note:  Left tonsillar cancer (2012)  Right laryngeal cancer (2022)    Hernia repair  Left neck surgery (2012)    Medications:  Medication Administrations This Visit       propofol (Diprivan) 500 mg/50 mL (10 mg/mL) infusion Admin Date  11/05/2020  06:00 Action  New Bag Dose  45 mcg/kg/min Route  Intravenous Site   Administered By  Rosalita Levan    Ordering Provider: Chrystie Nose,  Gregary Signs, MD          Allergies:   @ALLERGIES @    Social History:  Social History     Socioeconomic History   . Marital status: Single     Spouse name: Not on file   . Number of children: Not on file   . Years of education: Not on file   . Highest education level: Not on file   Occupational History   . Not on file   Tobacco Use   . Smoking status:  Not on file   . Smokeless tobacco: Not on file   Substance and Sexual Activity   . Alcohol use: Not on file   . Drug use: Not on file   . Sexual activity: Not on file   Other Topics Concern   . Not on file   Social History Narrative   . Not on file     Social Determinants of Health     Financial Resource Strain: Not on file   Food Insecurity: Not on file   Transportation Needs: Not on file   Physical Activity: Not on file   Stress: Not on file   Social Connections: Not on file   Intimate Partner Violence: Not on file   Housing Stability: Not on file       Family History:  @FAMHIST @    Review of Systems: \A complete ROS could not be completed due to patient condition    Physical Examination:  Vitals:    11/05/20 0600   Pulse: (!) 57   BP: (!) 94/52   Resp: 16   SpO2:    Height:    Weight:      General/Constitutional: lying in bed, orally intubated and sedated  Eyes: closed   Ears/Nose/Mouth/Throat: mucous membranes moist, hard fixed LAD of left neck, laryngeal landmarks palpable  Respirator: tolerating vent  Cardiovascular: wwp  Chest: symmetric chest rise  Musculoskeletal: normal bulk and tone  Skin: warm and dry  Neurologic: sedated, unable to assess  Psychiatric: sedated, unable to assess       Assessment and Plan:  Otolaryngology was consulted to see Cody Clark due to concern for respiratory failure requiring intubation. Patient currently intubated and sedated. Recently diagnosed with laryngeal SCC with mets to lungs.    Recommendations:  -OR for trach when cleared by anesthesia  -Consent  -Add on for OR    This patient was staffed with Dr. Rudene Anda, MD

## 2020-11-05 NOTE — Progress Notes (Signed)
11/05/20 2141   Oxygen Therapy   SpO2 100 %   Oxygen Therapy Supplemental oxygen   O2 Delivery Method (S)  Trach mask;Aerosol mask   FiO2 (%) 30 %   O2 Flow Rate (L/min) 10 L/min   Patient Activity Sleeping   Safety Instructions Yes (Comment)  (AMBU BAG AND SPARE TRACH AT BEDSIDE)   ROX Index 23.81   Respiratory Assessment   Assessment Type Assess only   Resp 14   Respiratory Pattern Normal   Chest Assessment Chest expansion symmetrical   Cough Coughs with suction   Bilateral Breath Sounds Clear;Diminished   Suctioning/Secretions   Suction Type Tracheal   Suction Device Catheter   Secretion Amount Scant   Secretion Color Blood streaked - old   Secretion Consistency Frothy   Suction Tolerance Tolerated well   Suctioning Adverse Effects None   Surgical Airway 11/05/20 Shiley 6   Placement Date/Time: 11/05/20 1718   Surgical Airway Type: Tracheostomy  Brand: Shiley  Style: Cuffed  Size (mm): 6   Cuff Leak Yes   Cuff Pressure (cm H2O) 28 cm H2O   Cuff Status Cuff Inflated   Site Assessment Drainage;Bleeding;Sutured   Site Care Cleansed;Dried;Open to air   Inner Cannula Care Other (Comment)  (Patent)   Ties Assessment Dry;Secure   Incentive Spirometry Treatment   Respiratory Depth/Rhythm Regular   Respiratory Effort Unlabored   Team's order is verified and patient is taken off ventilator.  Patient tolerated Pressure support 5/5, 30%.  Started on trach collar: 30%. Bilateral chest rise. Will continue to monitor.  Ambu bag and spare trach at bedside.

## 2020-11-05 NOTE — Op Note (Signed)
Otolaryngology Operative Note   Cody Clark - DOB: [birth year] (65 year old male) MRN: [id number]  Procedure Date: 11/05/2020 [place] [facility] OR       Preoperative Diagnosis:   Squamous cell carcinoma of larynx ([place]) [C32.9]    Post Operative Diagnosis:       * Squamous cell carcinoma of larynx ([place]) [C32.9]    Procedures Performed:    OPEN TRACHEOSTOMY size 6      Surgeons:     * Theadore Nan, MD - Primary     * Adolph Pollack, MD - Resident - Assisting     * Mariea Stable, MD - Resident - Assisting    Anesthesia:  General   EBL: 2 mL   Specimens: None   Wound Class: Procedure(s):  OPEN TRACHEOSTOMY size 6 - Wound Class: Class II/ Clean Contaminated     Indication(s): Cody Clark is an 65 year old male w/ a history of alcohol cirrhosis, CAD, and prior L oropharyngeal cancer treated w/ surgical resection (F[place] 2011) and chemoradiation. He has recently (January 2022) developed a new right laryngeal/piriform sinus cancer ([id number]) that was found to be poorly differentiated on biopsy by his local Otolaryngologist. He has a PET CT report in [place] reporting concern for pulmonary metastases. Per Care Everywhere notes, he has begun the process of radiation with plans of chemotherapy, and presented to the OSH with respiratory distress requiring intubation and need for airway stabilization with tracheostomy.     Finding(s):   Large thyroid cartilage  6.0 cuffed trach sutured in place  Likely difficult oral intubation given extent of right laryngeal cancer      Procedure Details:   The patient was brought to the operating room by anesthesia colleagues and placed supine on the operative room table.  The appropriate timeout and checklist were completed and noted to assure patient identification and procedure.  1% lidocaine with 1: 100,000 epinephrine was injected into the previously marked anterior neck location.    Then the patient was prepped and draped in the usual sterile fashion.  The  procedure was begun using a 15 blade noted to make an incision along the anterior neck skin crease. Using a combination of monopolar cautery the dissection was carried down to the level of the trachea.    Then once the airway was isolated, overlying tissue and fascia was cleared until an adequate exposure was achieved.  Meticulous hemostasis was achieved.  At this point we discussed with our anesthesia colleagues in order to reduce the risk of airway fire and coordination of endotracheal tube exchange for tracheostomy tube.  Using a 15 blade two parallel horizontal incisions were made into the trachea. Curved Mayo scissors were used to make vertical cuts connecting the parallel horizontal incisions.  At this point a 3 prong dilator was used to dilate the incision into the trachea and the trach was placed into the airway under direct visualization.  The anesthesia circuit was then reattached and confirmation of end-tidal CO2 was achieved.    The trach was then secured with 2-0 Prolene sutures and soft trach collar.     This end of the operative procedure and the patient was returned back to our anesthesia colleagues and transferred back to their unit.       Complications: None.     Condition: stable

## 2020-11-05 NOTE — Progress Notes (Addendum)
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 He has recently (January 2022) developed a new right laryngeal/piriform sinus cancer ([id number]) that was found to be poorly differentiated on biopsy by his local Otolaryngologist. He has a PET CT report in [place] reporting concern for pulmonary metastases. Per Care Everywhere notes, he has begun the process of radiation with plans of chemotherapy, and presented to the OSH with respiratory distress requiring intubation.    PET 10/25/20:  1. Abnormal FDG uptake associated with soft tissue density mass centered in the right pharynx and larynx compatible with known head neck carcinoma.  2. Abnormal FDG uptake associated within large right level 2 neck lymph node compatible with metastatic adenopathy.   3. Abnormal FDG uptake associated with multiple bilateral lung nodules compatible with metastatic disease.     09/27/2020: CT neck with contrast obtained at [facility] [place] in [place] showed an infiltrative right glottic mass involving infraglottic and supraglottic soft tissues, measuring 4.6 x 4.2 x 4.9 cm, with extension into the right piriform sinus. There was effacement of the right cord with infiltration of the right arytenoid cartilage and right thyroid cartilage. There was extension to the anterior commissure. The upper margin of the mass was contiguous with the posterior superior margin of the right hyoid bone along the margin appear from piriform sinus. Inferior extension extended into the superior margin of the thyroid gland and along the posterior lateral margin of the right cricoid. No definite neck lymphadenopathy.    09/27/2020: CT chest with contrast showed multiple variably sized pulmonary metastasis, new from a prior CT. There was cirrhosis and portal hypertension.       Plan:  - urgent social work consult to establish decision maker  -  cardiac work up/medical clearance for OR  - Once a Media planner is identified, please page our team as we would like to proceed to the OR for tracheostomy for airway stabilization.    See Oncology Note 10/21/20 in Care Everywhere:  "Assessment/Plan     1. 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    CT soft tissue neck 02/16/2011 showed complete remission.    He did very well until he developed right-sided sore throat, hoarseness, and dysphagia in January 2022. ENT laryngoscopic evaluation on 09/24/2020 showed a highly suspicious mass in the right piriform sinus with possible adenopathy in the right upper neck.    CT neck and chest on 09/27/2020 showed a 4.6 x 4.2 x 4.9 cm mass in the right glottic region extending into the right piriform sinus. There were multiple parenchymal nodules in the upper and lower lobes, with the dominant left lower lobe lesion measuring up to 1.6 cm.    CT neck with contrast on 10/17/2020 showed a 4.6 x 5.0 x 6.8 cm infiltrative right glottic mass, extending into the right piriform sinus. There was no definite neck lymphadenopathy. Incidental note made of prior left-sided neck dissection and sequelae of radiation therapy. He also had at least 5 new lesions in the right upper lung, the largest measuring 1.0 cm.    Dr. Jenny Reichmann Register, radiation oncology, saw the  patient on 10/11/2020 for consultation. Requested PET scan, which is pending, and recommended systemic therapy with chemotherapy/immunotherapy, with potential for focal palliative radiation to improve local control.    It has been about 10 years since his prior chemoradiation. Check PD-L1 status of his current disease. Discussed  with Dr. Gar Gibbon. If PD-L1 CPS score is less than 1 (negative), then I would recommend combination treatment with taxane therapy plus pembrolizumab. If CPS score is greater than 1, he is also a candidate for combination chemoimmunotherapy for rapidly progressive disease, or possibly single agent pembrolizumab, particularly if his PD-L1 CPS is greater than 20%.    Refer for placement of Port-A-Cath to facilitate treatment.    Return to clinic in approximately 2 weeks to begin treatment. I will discuss further with radiation oncology to coordinate care, as he may benefit from palliative radiation to the right neck to reduce his shortness of breath and dysphagia."

## 2020-11-05 NOTE — Progress Notes (Addendum)
Oto Brief Note    65 yo M w/ a history of alcohol cirrhosis, CAD, and prior L oropharyngeal cancer treated w/ surgical resection (Futran 2011) and chemoradiation. He has recently (January 2022) developed a new right laryngeal/piriform sinus cancer ([id number]) that was found to be poorly differentiated on biopsy by his local Otolaryngologist.    Patient transported to [facility] for airway intervention in the setting of respiratory distress and large laryngeal mass. Unfortunately we have been unable to locate or discuss his care with any family or legal next of kin. Our Education officer, museum colleagues provided contact information and a telephone number for Cody Clark [phone]), however despite multiple attempts from both the SICU and Oto team we have not been able to get her on the phone and confirm her relationship to that patient.    Patient presents with an oral ETT in place. The ETT small and nearly hubbed to provide adequate ventilation, and the ICU has reported challenges in positioning and managing the endotracheal tube. Given his difficult airway status with a large laryngeal mass, the patient requires a tracheostomy for a stable and more secure airway. We will continue trying to reach out to family and LNOK, however given the need to stabilize his airway we will plan to proceed with [facility] emergency consent for a tracheostomy. We have asked our ICU colleagues to also document the need for a tracheostomy.    This patient was discussed with Dr. Annett Gula, Otolaryngology Attending.    Cody Rider, MD  Resident  PGY-6  Otolaryngology - Head and Neck Surgery    PM Addendum:    Sedation was paused for the patient, and he was found to be alert and oriented to person, place, and situation. We discussed his condition, and the need for a tracheostomy. We explained the risks, benefits, and alternatives to tracheostomy. He was agreeable to the procedure and questions were answered. Informed consent was signed  and witnessed by bedside RN and ICU attending. Will proceed to OR for tracheostomy.

## 2020-11-05 NOTE — Progress Notes (Addendum)
Addendum:  MSW received notification from [facility] and SICU provider that they have been unsuccessful in contacting pt's sister Cody Clark. MSW attempted to contact Cody Clark an additional 3 times, leaving vm's requesting she call back this Probation officer or medical providers.    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  Anticipated disposition/needs: TBD - critically ill  Potential barriers/concerns:    Funding: Payor: MEDICARE / Plan: MEDICARE PART A AND B / Product Type: Medicare  Primary Decision Maker: LNOK confirmed as (Name and relationship: Cody Clark - sister)    Outpatient Providers:  Patient Care Team:  Lindell Spar, MD as PCP - General (Family Practice)  Bartolo Darter, MD as Managed Care (Internal Medicine)    INTERVENTIONS and REFERRALS:  The following interventions have been initiated: Consult with healthcare team;Other (comment) [facility] search)     SW consult placed for [facility] search for consent for OR for tracheostomy for airway stabilization.    After lengthy chart review, contacting PCP (Dr. Dwyane Dee), HCS and ALF, MSW was able to identify one emergency contact - Cody Clark [phone]).    MSW contacted Cody Clark [phone]) who self-identified as pt's sister. She is confirmed as LNOK, as pt's spouse has died and he has no children. She does report they have one additional sibling but they are not involved. Cody Clark understandably expressed great concern with this writer's t/c and vocalized desire for a quick t/c from the medical providers. MSW indicated they would pass along her contact information ASAP.     MSW provided SICU with Cody Clark's contact information.    Pt's plan TBD, pending pt's complicated medical course. SW continues to provide ongoing emotional support to pt and pt's family.     PLAN or OUTCOME:  TBD - critically ill    Cody Clark, MSW  General Surgery H,O,A,B and  Surgery ICU   [facility]

## 2020-11-06 ENCOUNTER — Inpatient Hospital Stay (HOSPITAL_COMMUNITY): Payer: Medicare Other

## 2020-11-06 ENCOUNTER — Other Ambulatory Visit: Payer: Self-pay

## 2020-11-06 DIAGNOSIS — R1310 Dysphagia, unspecified: Secondary | ICD-10-CM

## 2020-11-06 DIAGNOSIS — I252 Old myocardial infarction: Secondary | ICD-10-CM

## 2020-11-06 DIAGNOSIS — M00811 Arthritis due to other bacteria, right shoulder: Secondary | ICD-10-CM

## 2020-11-06 DIAGNOSIS — R7881 Bacteremia: Secondary | ICD-10-CM

## 2020-11-06 DIAGNOSIS — E46 Unspecified protein-calorie malnutrition: Secondary | ICD-10-CM

## 2020-11-06 DIAGNOSIS — K7030 Alcoholic cirrhosis of liver without ascites: Secondary | ICD-10-CM

## 2020-11-06 DIAGNOSIS — B951 Streptococcus, group B, as the cause of diseases classified elsewhere: Secondary | ICD-10-CM

## 2020-11-06 DIAGNOSIS — M00211 Other streptococcal arthritis, right shoulder: Secondary | ICD-10-CM

## 2020-11-06 LAB — GLUCOSE POC, ~~LOC~~
Glucose (POC): 100 mg/dL (ref 62–125)
Glucose (POC): 106 mg/dL (ref 62–125)
Glucose (POC): 110 mg/dL (ref 62–125)
Glucose (POC): 114 mg/dL (ref 62–125)
Glucose (POC): 118 mg/dL (ref 62–125)

## 2020-11-06 LAB — PHOSPHATE
Phosphate: 1.8 mg/dL — ABNORMAL LOW (ref 2.5–4.5)
Phosphate: 1.6 mg/dL — ABNORMAL LOW (ref 2.5–4.5)

## 2020-11-06 LAB — BASIC METABOLIC PANEL
Anion Gap: 2 — ABNORMAL LOW (ref 4–12)
Anion Gap: 5 (ref 4–12)
Calcium: 8 mg/dL — ABNORMAL LOW (ref 8.9–10.2)
Calcium: 8.2 mg/dL — ABNORMAL LOW (ref 8.9–10.2)
Carbon Dioxide, Total: 29 meq/L (ref 22–32)
Carbon Dioxide, Total: 28 meq/L (ref 22–32)
Chloride: 105 meq/L (ref 98–108)
Chloride: 104 meq/L (ref 98–108)
Creatinine: 0.8 mg/dL (ref 0.51–1.18)
Creatinine: 0.76 mg/dL (ref 0.51–1.18)
Glucose: 113 mg/dL (ref 62–125)
Glucose: 111 mg/dL (ref 62–125)
Potassium: 4.2 meq/L (ref 3.6–5.2)
Potassium: 4.1 meq/L (ref 3.6–5.2)
Sodium: 136 meq/L (ref 135–145)
Sodium: 137 meq/L (ref 135–145)
Urea Nitrogen: 26 mg/dL — ABNORMAL HIGH (ref 8–21)
Urea Nitrogen: 22 mg/dL — ABNORMAL HIGH (ref 8–21)
eGFR by CKD-EPI 2021: >60 mL/min/{1.73_m2} (ref >59)
eGFR by CKD-EPI 2021: >60 mL/min/{1.73_m2} (ref >59)

## 2020-11-06 LAB — HEMOGLOBIN A1C, HPLC: Hemoglobin A1C: 6.2 % — ABNORMAL HIGH (ref 4.0–6.0)

## 2020-11-06 LAB — CBC (HEMOGRAM)
Hematocrit: 32 % — ABNORMAL LOW (ref 38.0–50.0)
Hemoglobin: 10 g/dL — ABNORMAL LOW (ref 13.0–18.0)
MCH: 25.1 pg — ABNORMAL LOW (ref 27.3–33.6)
MCHC: 31.7 g/dL — ABNORMAL LOW (ref 32.2–36.5)
MCV: 79 fL — ABNORMAL LOW (ref 81–98)
Platelet Count: 51 10*3/uL — ABNORMAL LOW (ref 150–400)
RBC: 3.98 10*6/uL — ABNORMAL LOW (ref 4.40–5.60)
RDW-CV: 21.2 % — ABNORMAL HIGH (ref 11.6–14.4)
WBC: 7.93 10*3/uL (ref 4.3–10.0)

## 2020-11-06 LAB — MAGNESIUM
Magnesium: 2 mg/dL (ref 1.8–2.4)
Magnesium: 1.8 mg/dL (ref 1.8–2.4)

## 2020-11-06 LAB — TROPONIN_I
Troponin_I Interpretation: ELEVATED
Troponin_I: 0.05 ng/mL — ABNORMAL HIGH (ref <0.04)

## 2020-11-06 LAB — 1ST EXTRA BLUE TOP

## 2020-11-06 MED ORDER — INSULIN LISPRO 100 UNIT/ML IJ SOLN
0.0000 [IU] | Freq: Four times a day (QID) | INTRAMUSCULAR | Status: DC
Start: 2020-11-06 — End: 2020-11-08

## 2020-11-06 MED ORDER — CARVEDILOL 6.25 MG OR TABS
6.2500 mg | ORAL_TABLET | Freq: Two times a day (BID) | ORAL | Status: DC
Start: 2020-11-06 — End: 2020-11-12
  Administered 2020-11-06 – 2020-11-12 (×12): 6.25 mg via GASTROSTOMY
  Filled 2020-11-06 (×13): qty 1

## 2020-11-06 MED ORDER — GLUCAGON HCL RDNA (DIAGNOSTIC) 1 MG IJ SOLR
0.5000 mg | INTRAMUSCULAR | Status: DC | PRN
Start: 2020-11-06 — End: 2020-11-12

## 2020-11-06 MED ORDER — CEFDINIR 300 MG OR CAPS
300.0000 mg | ORAL_CAPSULE | Freq: Two times a day (BID) | ORAL | Status: DC
Start: 2020-11-07 — End: 2020-11-12
  Administered 2020-11-07 – 2020-11-12 (×11): 300 mg via ORAL
  Filled 2020-11-06 (×12): qty 1

## 2020-11-06 MED ORDER — CARVEDILOL 6.25 MG OR TABS
6.2500 mg | ORAL_TABLET | Freq: Two times a day (BID) | ORAL | Status: DC
Start: 2020-11-06 — End: 2020-11-06

## 2020-11-06 MED ORDER — LACTULOSE 10 GM/15ML OR SOLN
20.0000 g | Freq: Every day | ORAL | Status: DC
Start: 2020-11-07 — End: 2020-11-12
  Administered 2020-11-07 – 2020-11-12 (×5): 20 g via GASTROSTOMY
  Filled 2020-11-06 (×5): qty 30

## 2020-11-06 MED ORDER — NICOTINE 14 MG/24HR TD PT24
1.0000 | MEDICATED_PATCH | Freq: Every day | TRANSDERMAL | Status: DC
Start: 2020-11-06 — End: 2020-11-12
  Administered 2020-11-06 – 2020-11-12 (×7): 1 via TRANSDERMAL
  Filled 2020-11-06 (×7): qty 1

## 2020-11-06 MED ORDER — GLUCAGON HCL RDNA (DIAGNOSTIC) 1 MG IJ SOLR
1.0000 mg | INTRAMUSCULAR | Status: DC | PRN
Start: 2020-11-06 — End: 2020-11-12

## 2020-11-06 MED ORDER — SPIRONOLACTONE 25 MG OR TABS
100.0000 mg | ORAL_TABLET | Freq: Every day | ORAL | Status: DC
Start: 2020-11-07 — End: 2020-11-12
  Administered 2020-11-08 – 2020-11-12 (×4): 100 mg via ORAL
  Filled 2020-11-06 (×2): qty 1
  Filled 2020-11-06 (×2): qty 4

## 2020-11-06 MED ORDER — LABETALOL HCL 5 MG/ML IV SOLN
10.0000 mg | INTRAVENOUS | Status: DC | PRN
Start: 2020-11-06 — End: 2020-11-12
  Administered 2020-11-06: 10 mg via INTRAVENOUS
  Filled 2020-11-06 (×2): qty 4

## 2020-11-06 MED ORDER — DEXTROSE 10 % IV SOLN
50.0000 mL/h | INTRAVENOUS | Status: DC | PRN
Start: 2020-11-06 — End: 2020-11-12

## 2020-11-06 MED ORDER — FUROSEMIDE 40 MG OR TABS
40.0000 mg | ORAL_TABLET | Freq: Every day | ORAL | Status: DC
Start: 2020-11-07 — End: 2020-11-12
  Administered 2020-11-08 – 2020-11-12 (×5): 40 mg via ORAL
  Filled 2020-11-06 (×3): qty 1
  Filled 2020-11-06 (×2): qty 2

## 2020-11-06 MED ORDER — DEXTROSE 10% IV SOLUTION BOLUS
125.0000 mL | Status: DC | PRN
Start: 2020-11-06 — End: 2020-11-12

## 2020-11-06 MED ORDER — HYDROMORPHONE HCL 2 MG OR TABS
1.0000 mg | ORAL_TABLET | ORAL | Status: DC | PRN
Start: 2020-11-06 — End: 2020-11-12
  Administered 2020-11-06 – 2020-11-12 (×33): 2 mg via ORAL
  Filled 2020-11-06 (×33): qty 1

## 2020-11-06 MED ORDER — FIRST-OMEPRAZOLE 2 MG/ML OR SUSP
20.0000 mg | Freq: Every day | ORAL | Status: DC
Start: 2020-11-07 — End: 2020-11-12
  Administered 2020-11-07 – 2020-11-12 (×5): 20 mg via GASTROSTOMY
  Filled 2020-11-06 (×8): qty 10

## 2020-11-06 NOTE — Progress Notes (Signed)
11/06/20 0231   Oxygen Therapy   SpO2 98 %   Oxygen Therapy Supplemental oxygen   O2 Delivery Method Trach mask;Aerosol mask   FiO2 (%) 30 %   O2 Flow Rate (L/min) 10 L/min   Patient Activity Sleeping   Safety Instructions Yes (Comment)  (AMBU bag at bedside)   ROX Index 25.13   Respiratory Assessment   Assessment Type Assess only   Resp (!) 13   Respiratory Pattern Normal   Chest Assessment Chest expansion symmetrical   Cough Productive;Coughs with suction   Suction No   Bilateral Breath Sounds Clear;Diminished   Airways   Airway LDA Tracheostomy   Surgical Airway 11/05/20 Shiley 6   Placement Date/Time: 11/05/20 1718   Surgical Airway Type: Tracheostomy  Brand: Shiley  Style: Cuffed  Size (mm): 6   Cuff Leak Yes   Cuff Pressure (cm H2O) 28 cm H2O   Cuff Status Cuff Inflated   Site Assessment Drainage;Bleeding;Sutured   Site Care Cleansed;Dried;Open to air   Fernley Other (Comment)  (Patent)   Ties Assessment Dry;Secure   Patient is tolerating trach collar: 30%. Bilateral chest rise.  No signs of resp distress noted. Will continue to monitor.  Has a secured #6shiley, cuffed. Ambu bad and spare trach at bedside.

## 2020-11-06 NOTE — Nursing Note (Signed)
Patient Summary  65 yo M w/ multiple throat/Neck masses     Med Hx: CAD, CHF, CKD, cirrhosis, HTN, HLD, MI w/ stents    7/12: Admitted to SICU 0600 from OSH d/t recurrent tonsillar CA. Pt experiencing SOB/Laryngeal mass obstructing airway. Intubated in the OR for airway protection at OSH.Transferred to [facility] for further OTO surgery/management.     0700- Brady down to 20's, asystole, Code called but pulse present after 2 mins.   -EKG and TTE done    1530- ETT advanced & down to OR for Trach. 2200 - Extubated to Aerosolized trach collar 30%  Edited by: Rosalita Levan at 11/05/2020 2214    Illness Severity  Stable  Edited byRosalita Levan at 11/05/2020 2214    #6 shiley sutured in place with cuff down. Pt has moderate oral and tracheal secretions which he is able to clear and suction on his own. Lungs clear/dim. Trickle TFs started. OOB with SBA and uses four wheel walker to ambulate. Pain controlled with PRN dilaudid.

## 2020-11-06 NOTE — Nursing Note (Signed)
Patient Summary  66 yo M w/ multiple throat/Neck masses     Med Hx: CAD, CHF, CKD, cirrhosis, HTN, HLD, MI w/ stents    7/12: Admitted to SICU 0600 from OSH d/t recurrent tonsillar CA. Pt experiencing SOB/Laryngeal mass obstructing airway. Intubated in the OR for airway protection at OSH.Transferred to [facility] for further OTO surgery/management.     0700- Brady down to 20's, asystole, Code called but pulse present after 2 mins.   -EKG and TTE done    1530- ETT advanced & down to OR for Trach. 2200 - Extubated to Aerosolized trach collar 30%    Illness Severity  Stable    A&O x 4, RASS -2, CAM - and able to make needs known. Extubated without complications. Aerosolized trach mask deflated during AM labs. Able to cough up secretions - copious, tenacious and bloody. Oral and catheter suctioning required. Metoprolol 10mg  x 1 needed to sustain SBP <160. Decreased UOP given 500 ml LR. TROP trending upward. No electrolytes replaced

## 2020-11-06 NOTE — Progress Notes (Signed)
Social Work Psychosocial Assessment    Social Work Summary:  Assessment:   Social worker received referral from primary team to assess for discharge needs and explore possible respiratory SNF (skilled nursing facility) placement.    Reviewed chart.  Mr. Dalgleish is a 65-year-old male with a history of alcohol cirrhosis, CAD, and prior L oropharyngeal cancer treated w/ surgical resection (Dr. Futran 2011) and chemoradiation.  He has recently (January 2022) developed a new right laryngeal/piriform sinus cancer ([id number]) that was found to be poorly differentiated on biopsy by his local Otolaryngologist.      Intervention:   Patient was discussed in Progression of Care rounds with Otolaryngology ARNP (Carol Stimson).  Plan is for respiratory SNF placement.    Social worker met with patient at bedside.  Education provided on social worker's role and contact information was provided.  Patient is 65-years-old man who resides alone in [place], [place]  Prior to admission, patient reports having in-home caregiver services through [employer] [facility] [employer] [employer] [facility].  His caregiver recently quit and he is awaiting assignment of a new [employer] caregiver.  Patient ambulates independently with a rollator walker and drives himself to and from medical appointments.  He is aware of the non-emergency transportation service through Medicaid, [employer], in [place] and does not like to use their services due to the long waiting time.  Patient has Medicare Part A & B as his primary insurance and  State Medicaid - [place] as his secondary.    Dr. Rupesh Kumar ([phone]) is his Primary Care Physician at [facility].    Patient reports that he missed an appointment yesterday to establish oncology care at [facility].      Social worker discussed with patient the available discharge options (home health versus  SNF).  Patient prefers to receive sub-acute rehab at a SNF.  He does not have any family or friends who are able to provide care upon discharge.   Patient is widowed; does not have any children and his parents are deceased.  He has an estranged relationship with his only two siblings (sister, Shirley [phone] in [place]) and (sister, Ginger [phone] in [place]).  Patient does not have a health care durable power of attorney and does not want to select anyone at this time.  He listed his two sister as his emergency contacts and legal next of kin.    Social worker offered patient choice of SNFs with metrics.  He was provided with a list of respiratory SNFs.  Explained to patient that there are limited SNFs with respiratory units in [place] and he expressed understanding.  Social worker explained how insurance works for post-acute services.  Explained differences between acute hospital care and SNF/IPR/LTAC level.  Advised that he will be referred to a facility of his choice.  Acceptance may be based on insurance network/preauth determination and availability of bed/service needed.  Reviewed list of respiratory SNFs with patient.  Patient had selected [facility] as choice of respiratory SNF.      Plan or outcome:   Social worker sent respiratory SNF referral to [facility] and communicated with Director of Admissions (Ryan Quiza).      Social worker will follow-up with [facility] on admissions status and bed availability.    Social worker will continue to provide updates to [facility] and advice the team appropriately.      Anticipated discharge needs:     Patient will need transportation arranged to respiratory SNF on the day of discharge.      Patient's goal for discharge:   Patient's goal is to receive sub-acute rehab at a respiratory SNF.

## 2020-11-06 NOTE — Consults (Addendum)
Inpatient Initial Nutrition Assessment      Assessment    65 yo M w/ hx of alcohol cirrhosis, CAD, and prior left oropharyngeal cancer treated w/ surgical resection (2011) and chemoradiation. He has recently (January 2022) developed a new right laryngeal/piriform sinus cancer ([id number]) that was found to be poorly differentiated on biopsy by his local Otolaryngologist. He is now POD#1 s/p tracheostomy, stable.    Reason for Consult: enteral nutrition    Diagnoses:   Patient Active Problem List   Diagnosis   . Alcoholic cirrhosis of liver with ascites ([place])   . Chronic systolic heart failure ([place])   . Coronary artery disease due to calcified coronary lesion   . Squamous cell carcinoma in situ   . Squamous cell carcinoma of larynx [facility])       Past Medical/Surgical History:   As above.    Admission Anthropometrics:  (11/05/2020  5:44 AM)  Height: 171.2 cm (5' 7.4")  Weight: 60.6 kg (133 lb 9.6 oz) -- bed scale wt   Admit BMI (Calculated): 20.72kg/m2  Percentage of IBW (Male): 52.17  [place] IBW/kg (Calculated) Male: 68.23  UBW: ~140# (63.6 kg) -- per pt  %UBW: 95%    Wt Hx:  Weight for the past 168 hrs:   Weight   11/05/20 0556 60.6 kg (133 lb 9.6 oz)      Current BMI: Body mass index is 20.68 kg/m.     Wt Readings from Last 60 Encounters:   11/05/20 60.6 kg (133 lb 9.6 oz)   01/15/20 63.8 kg (140 lb 11.2 oz)      Care Everywhere Wts:   11/04/20: 59 kg  10/21/20: 59.9 kg  10/15/20: 60.5 kg  10/11/20: 61.2 kg  06/30/20: 62 kg  04/30/20: 61.5 kg  04/05/20: 62.6 kg  01/10/20: 62.9 kg  11/13/19: 66.4 kg  10/30/19: 67.3 kg  10/07/19: 68.4 kg  08/09/19: 66.5 kg    %Wt loss:   -Wt relatively stable over past 6 + mo's per EMR. Possible 9% wt loss x 1 year (not clinically significant)   - However, based on pt's reported wt hx, he has possibly lost 5% of his body wt x 1 month (significant)       Labs:  Labs (last 24 hours):   Chemistries  CBC  LFT    136 105 26 110   10.0   AST: - ALT: -    4.2 29 0.80   7.93 >< 51  AP: - T bili:  -    eGFR: >60 Ca: 8.0   32   Prot: - Alb: -    Mg: 2.0 PO4: 1.8  ANC: -         ALC: -        Lab Results   Component Value Date    A1C 6.2 (H) 11/06/2020     No results found for: VITD    Medications:  aspirin, 81 mg, Per Feeding Tube, Daily  atorvastatin, 40 mg, Per NG Tube, Daily  cefTRIAXone, 2 g, Intravenous, q24h  heparin, 5,000 units, Subcutaneous, q8h [place]  pantoprazole, 40 mg, Intravenous, Daily       dextrose 5 % and lactated ringers, 25 mL/hr, Last Rate: 25 mL/hr (11/06/20 1352)  insulin REGULAR, 0-19 Units/hr, Last Rate: Stopped (11/06/20 1100)    - reviewed     Allergies:  NKFA    Lines/Drains/Airways/Wounds:  NGT (7/12/222)  Trach (11/05/20)  Incision wound to neck  PI to sacrum - not staged -  present on admit    Diet/TF orders:  Active Diet Orders   Diet    NPO diet NPO except for medications        Nutrition Requirements:  *Using admit wt, 60.6 kg  ENERGY: 1820-2120 kcal (30-35 kcal/kg)   PROTEIN: 79-91 g (1.3-1.5 g/kg)   FLUID: per MD       Nutrition Assessment:  Nutrition Hx:  - Brief interview today, pt fatigued, recently extubated. Pt mouths words to communicate (trach in place). Pt reports gradual decline in PO over past couple of weeks or so. Reports good appetite at home, but eating smaller portions than usual. Denies food allergies/intolerances. Home supplements include Vit D 1000 unit/day (need to confirm w/ pt if he was taking this at f/u).  - Hx alcoholic cirrhosis noted.  - Wt: pt reports UBW of ~140# (63.6 kg). Wt relatively stable over past 6 + mo's per EMR, possible 9% wt loss x 1 year (not clinically significant). However, based on pt's reported wt hx, he has possibly lost 5% of his body wt x 1 month (significant).    FEN:  - NPO, s/p trach  - NGT in place, c/s for EN. Discussed EN plan with pt  - Per MD note, plan to c/s IR/GI for PEG placement   - D5-LR @25  mL/hr    GI:  - No BM in hosp yet  - No N/V    Skin:  - PI to sacrum - not staged, present on admit    Labs:  - Phos 1.8-   -  A1c (11/06/20): 6.2% (pre-DM)   - RDW 21.2+, MCHC 31.7-    Nutrition-Focused Physical Assessment:  Muscle Wasting - Temporalis Moderate or Severe, Muscle Wasting - Clavicle Moderate or Severe, Muscle Wasting - Deltoid Moderate or Severe, Muscle Wasting - Scapula Moderate or Severe, Muscle Wasting - Thigh Moderate, Muscle Wasting - Calf Moderate, Subcutaneous Fat Loss - Orbital Moderate and Subcutaneous Fat Loss - Tricep Moderate    Edema: none noted      Evaluation of Nutritional Status    Nutrition Diagnosis: Malnutrition (moderate, possibly severe), related to decreased PO intake  as evidenced by diet recall (<75% of EER for >7 days) and NFPE (>/= moderate loss of muscle & subcutaneous fat)    Interventions:  Chart reviewed., Discussed status, point of care/goals with patient., Discussed current intake and tolerance with patient. and Adjusted EN order.    Goals of Interventions:  To support healing/repletion. and To support anabolism.    Plan:  Follow nutrition support plan., Monitor oral intake and tolerance. and Monitor nutrition lab trends, weight trends.    Recommendations:  PO  -- ADAT per OTO/SLP (currently NPO)    Nutrition Support  -- Initiate EN via NG tube:  Jevity 1.5 @ 10 mL/hr, advance by 10 mL/hr Q6-8H to goal rate @ 60 mL/hr   (= 1320 mL TF, 1980 kcal, 84 g Pro, 285 g CHO, 1 L FW)  *Calculated over 22 hrs to allow for interruptions for care  Minimum 30 mL FW flush q 4 hrs while TF are running to maintain patency    Labs / Refeeding Risk  *Pt is at mod-high risk for refeeding syndrome:  -- Check BMP + Mag + Phos Q12H for first 1-2 days of EN, followed by daily checks until stable.   -- Aggressive lyte repletion PRN   -- Thiamine 100 mg pft daily x 7 days  -- Advance EN more cautiously if s/sx of refeeding  Vitamins/Minerals  -- Daily liquid MVI until EN is maintained at goal rate  -- Thiamine 100 mg pft daily x 7 days     Monitoring  -- Monitor TF intakes/tolerance; BMs; Lytes/BG; Wt  trends    Referral  -- Referral to Home Infusion needed if pt is expected to eventually d/c home w/ nutrition support.     _______________________  RD following.   See treatment team for contact.  Weekend pager: [phone]  Levander Campion, MS, RD, CD, [place]

## 2020-11-06 NOTE — Progress Notes (Signed)
Progress Note   Cody Clark") - DOB: [birth year] 65 year old male)  Preferred Pronouns: patient's name  Admit Date: 11/05/2020  Code Status: Full Code       CHIEF CONCERN / IDENTIFICATION:  Cody Clark is a 65 year old male with h/o CAD, CKD, cirrhosis, HTN, hyperlipidemia and recurrent tonsillar cancer s/p intubation for airway protection. Awaiting surgery/managemnt with Oto.      SUBJECTIVE   INTERVAL HISTORY:  Pt Trached by OTO yesterday. Pt has done well overnight. Trops reached plateau. Medicine Consulted to help managed chronic issues. IR consulted for PEG tube placement     Review of Systems  Unable to perform given pts clinical status         OBJECTIVE     Vitals (Most recent in last 24 hrs)     T: 36.4 C (11/06/20 0800)  BP: (!) 119/56 (11/06/20 1300)  HR: 80 (11/06/20 1300)  RR: (!) 23 (11/06/20 1300)  SpO2: 93 % (11/06/20 1300) Trach mask 10 L/min 30 %  T range: Temp  Min: 35.8 C  Max: 36.4 C  Admit weight: 60.6 kg (133 lb 9.6 oz) (11/05/20 0556)  Last weight: 60.6 kg (133 lb 9.6 oz) (11/05/20 0556)       I&Os:     Intake/Output Summary (Last 24 hours) at 11/06/2020 1312  Last data filed at 11/06/2020 1300  Intake 2493.38 ml   Output 893 ml   Net 1600.38 ml     Respiratory Data:  Resp: 23 (07/13 1300)  SpO2: 93 % (07/13 1300)  FiO2 (%): 30 % (07/13 1300)  Pulse Oximetry Type: Continuous (07/13 0600)  Oxygen Therapy: Supplemental oxygen (07/13 0930)  O2 Delivery Method: Trach mask (07/13 1300)  O2 Flow Rate (L/min): 10 L/min (07/13 0930)  Vent Mode: Pressure support (07/12 1941)  S VT: 480 mL (07/12 1717)  PEEP/CPAP (cm H2O): 5 cm H20 (07/12 1941)  PR SUP: 5 cm H20 (07/12 1941)    Physical Exam  General/Constitutional: Older than stated aged looking man laying in SICU bed, trached and in NAD  Eyes _PERRL  Ears/Nose/Mouth/Throat _MMM, Trach site is clean dry and intact  Neck _soft and supple  Respiratory _Clear BS bilat  Cardiovascular _RR, NSR. Ext are warm and well  perfused  GI/Abdominal _Soft and nontender with palpation  GU _deferred exam. Foley cath draining clear yellow urine  Musculoskeletal _Good bulk  Skin _warm, dry, intact and normal color for ethnicity  Neurologic _non focal exam  Psychiatric _Calm and cooperative      Labs (last 24 hours):   Chemistries  CBC  LFT  Gases, other   136 105 26 110   10.0   AST: - ALT: -  -/-/-/-  -/-/-/-   4.2 29 0.80   7.93 >< 51  AP: - T bili: -  Lact (a): - Lact (v): -   eGFR: >60 Ca: 8.0   32   Prot: - Alb: -  Trop I: 0.10 D-dimer: -   Mg: 2.0 PO4: 1.8  ANC: -     BNP: - Anti-Xa: -     ALC: -    INR: -        Data Review:      Reviewed Results? Independently visualized & interpreted? Key Findings     Lab [x]  []     Radiology [x]  []     EKG/Tele/Echo  [x]  []     Other?  []  []   ASSESSMENT/PLAN      Cody Clark is a 65 year old male with h/o CAD, CKD, cirrhosis, HTN, hyperlipidemia and recurrent tonsillar cancer s/p intubation for airway protection. Awaiting surgery/managemnt with Oto.     # History of laryngeal cancer s/p tracheostomy  Plan per OTO:  - Ok for cuff down  - HTC as tolerated  - RT for trach teaching  - Discharge RT to begin coordinating outpatient supplies    # Malnutrsition  # Dysphagia  - NG in place, ok to start TFs  - Monitor electrolytes for refeeding syndrome  - Consult IR/GI for PEG placement  - F/u nutrition recs for TFs  - Begin coordinating outpatient tube feed supplies    # Dispo  - SW to assist in discussing disposition to home vs SNF  - Anticipate d/c with trach and gtube    Chronic issues--------------------------------------------------------------------  # HTN  # HLD  # CAD  # History of IH:WTUUEK in 2019 and STEMI in 2021 with stent placement in June 2021  - Medicine Consult service to help manage chronic issues  - Aspirin 55m daily per NG  - Atorvastatin 486mqHs per NG  - Carvedilol 6.2528mID per NG    # 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 He stopped drinking  after the diagnosis for 10 years. Started drinking again after his wife died. He stopped drinking again in 20  - Medicine Consult  - Furosemide 51m63mily per NG  - Spironolactone 100mg55mly per NG  - Lactulose 20 grams prn per NG     # R acromioclavicular septic arthritis   # Group B Strep Septicemia   S/p hospitalization for 55 days for IV abx in March 2022.  - Oral Cefdinir 300mg 48min outpatient setting  - Currently on Ceftriaxone 2 g IV Q 24hrs    # GERD  - Omeprazole 20mg d33m per NG      ICU Checklist:    COVID-19 surveillance: Negative 11/05/2020  Delirium prevention/therapy: per protocol  Fluids/electrolytes: Electrolytes per protocol  Diet: NPO diet NPO except for medications. Tube feeds  Minimum mobility goal: bedrest (intubated)  DVT Prophylaxis: SCDs while awaiting OTO plan  GI Prophylaxis: PPI  Glucose control: NI  Lines/Drains/Airways: Trach, foley, PIV, NG tube  Labs: CBC, BMP, Mag, phos daily  Antibiotic stewardship: Cefdinir (hold)  Disposition: OTO  Code Status: Full Code    Interim Summary Due Date: 11/12/20  Contacts: Primary Emergency Contact: [place]HillsboroPhone: [phone] Kin: Cody Clark      Thank you for the opportunity to participate in the care of this patient    Cody Clark

## 2020-11-06 NOTE — Progress Notes (Signed)
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    SUBJECTIVE:  S/p tracheostomy  No significant bleeding  Patient liberated from ventilator, sedation weaned  Patient reports previous discussions with primary oncology providers about     OBJECTIVE:  Neck: 6-0 shiley sutured in place, mild bloody tinged mucus around flanges. Inner cannula in place. Suctioned old blood and mucus.Deflated cuff.  Resp: Comfortable on RA    ASSESSMENT/PLAN:  65 yo M w/ a history of alcohol cirrhosis, CAD, and prior L oropharyngeal cancer treated w/ surgical resection Annett Gula 2011) and chemoradiation. 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 POD1 tracheostomy, stable.    #laryngeal cancer s/p tracheostomy  - Ok for cuff down  - HTC as tolerated  - RT for trach teaching  - Discharge RT to begin coordinating outpatient supplies    #malnutrsition  #dysphagia  - NG in place, ok to start TFs  - Monitor electrolytes for refeeding syndrome  - Consult IR/GI for PEG placement  - F/u nutrition recs for TFs  - Begin coordinating outpatient tube feed supplies    #Dispo  - SW to assist in discussing disposition to home vs SNF  - Anticipate d/c with trach and gtube    Frann Rider, MD  Resident  PGY-6  Otolaryngology - Head and Neck Surgery

## 2020-11-06 NOTE — Consults (Addendum)
Consult Note     Cody Clark Gwyndolyn Saxon") - DOB: 06-Aug-1955 (65 year old male)  Preferred Pronouns: patient's name  Admit Date: 11/05/2020  Code Status: Full Code         Inpatient Consult to Internal Medicine  Consult performed by: Orlena Sheldon, MD  Consult ordered by: Mayer Camel, ARNP          CHIEF CONCERN / IDENTIFICATION:  Cody Clark is a 65 year old male with hx CAD, cirrhosis, HTN, recurrent laryngeal cancer who presented with airway compromise at OSH s/p intubation transferred here to [facility] now s/p tracheostomy on 7/12.    Medicine consulted for assistance with management of comorbidities.      SUBJECTIVE   HISTORY OF PRESENT ILLNESS:   Pt with hx of tonsillar cancer in 2011 s/p resection and chemoradiation recently found to have recurrent disease. Went to OSH for respiratory distress where he was intubated and then transferred to [facility] where yesterday he underwent tracheostomy.    Notably yesterday AM, he had self resolved episode of bradycaria to 20's. Trop mildly elevated at 0.09. TTE reassuring.    He is scheduled for GJ tube with IR on Monday.     He denies any dyspnea currently. Has been coughing up a fair amount of secretions. Denies chest pain, N/V. He has NGT placed, but tube feeds have yet to be started. He is on diuretics at home and states since he has been on them, no issues with ascites or LE edema. He takes lactulose at home and has 2-3 BM/day.     Review of Systems  I have performed a complete review of systems with the patient, which is negative except as indicated per HPI.      HISTORY   Problem List   Diagnosis   . Alcoholic cirrhosis of liver with ascites ([place])   . Chronic systolic heart failure ([place])   . Coronary artery disease due to calcified coronary lesion   . Squamous cell carcinoma in situ   . Squamous cell carcinoma of larynx (HCC)       No past surgical history on file.  Social Hx: Lives alone. Does not have other support at home. Denies current etoh use and smokes  cigarettes to a pack a day       No family history on file.   Family history: Denies family history of laryngeal cancer       MEDICATIONS:   SCHEDULED MEDICATIONS:   .  aspirin, 81 mg, Daily  .  atorvastatin, 40 mg, Daily  .  cefTRIAXone, 2 g, q24h  .  heparin, 5,000 units, q8h [facility]  .  pantoprazole, 40 mg, Daily    INFUSED MEDICATIONS:  dextrose 5 % and lactated ringers, 25 mL/hr, Continuous, Last Rate: 25 mL/hr (11/06/20 1352)  .  insulin REGULAR, 0-19 Units/hr, Continuous, Last Rate: Stopped (11/06/20 1100)     PRN MEDICATIONS:  calcium gluconate, 2 g, q6h PRN  .  dextrose, 125 mL, PRN  .  glucagon, 0.5 mg, PRN  .  glucagon, 1 mg, PRN  .  HYDROmorphone, 1-2 mg, q3h PRN  .  labetalol, 10 mg, Q2 Hours PRN  .  lactulose, 20 g, Daily PRN  .  magnesium sulfate, 2 g, q6h PRN  .  perflutren lipid microsphere (Definity) in NS injection, 1-10 mL, Once PRN  .  potassium chloride, 20 mEq, q6h PRN  .  potassium chloride, 40 mEq, q6h PRN  .  potassium  chloride ER, 20 mEq, q6h PRN  .  potassium chloride ER, 40 mEq, q6h PRN  .  potassium phosphate, 40 mEq, PRN  .  sodium phosphate, 40 mEq, PRN    Current Outpatient Medications   Medication Instructions   . aspirin 81 mg, Oral, Daily   . atorvastatin (LIPITOR) 40 mg, Oral, Daily   . carVEDilol (COREG) 6.25 mg, Oral, 2 times daily   . cefdinir (OMNICEF) 300 mg, Oral, Every 12 hours scheduled   . furosemide 40 MG tablet Oral   . lactulose 20 g, Oral, Daily PRN   . melatonin 3 mg, Oral, Nightly PRN   . nicotine 14 MG/24HR patch 1 patch, Transdermal, Daily   . omeprazole (PRILOSEC) 20 mg, Oral, Daily empty stomach   . oxyCODONE 5 mg, Oral, Every 8 hours PRN   . spironolactone (ALDACTONE) 100 mg, Oral, Daily       ALLERGIES:   Apap [acetaminophen], Pcn [penicillins], and Vancomycin     OBJECTIVE     Vitals (Arrival)      T: (!) 35.6 C (11/05/20 0600)  BP: 123/68 (11/05/20 0542)  HR: (!) 57 (11/05/20 0542)  RR: 16 (11/05/20 0542)  SpO2: 100 % (11/05/20 0549) Ventilator 10 L/min 30 %    Vitals (Most recent in last 24 hrs)   T: 36.4 C (11/06/20 0800)  BP: (!) 144/62 (11/06/20 1200)  HR: 66 (11/06/20 1200)  RR: 17 (11/06/20 1200)  SpO2: 100 % (11/06/20 1200) Trach mask (P) 10 L/min 30 %  T range: Temp  Min: 35.8 C  Max: 36.4 C  Wt 133 lb 9.6 oz (60.6 kg)     Ht 5' 7.402" (1.712 m)     Body mass index is 20.68 kg/m.       Physical Exam  GENERAL: sitting in chair in NAD  EYES: Anicteric, EOMI  ENT: trach in place with bloody tinged mucus around trach site. NGT in place  CV: RRR. Normal S1, S2. No mrg  RESP: Unlabored breathing. CTA b/l  GI:  Abd Soft, NTND  MSK: WWP. No LE edema  Skin: warm, dry, no rashes  NEURO: Alert, oriented. Face symmetric. Speech fluent, moves all limbs. No asterixes  PSYCH: Pleasant, appropriate affect     Labs (last 24 hours):   Chemistries  CBC  LFT  Gases, other   136 105 26 110   10.0   AST: - ALT: -  -/-/-/-  -/-/-/-   4.2 29 0.80   7.93 >< 51  AP: - T bili: -  Lact (a): - Lact (v): -   eGFR: >60 Ca: 8.0   32   Prot: - Alb: -  Trop I: 0.10 D-dimer: -   Mg: 2.0 PO4: 1.8  ANC: -     BNP: - Anti-Xa: -     ALC: -    INR: -        IMAGING:   I have reviewed the latest radiology results  XR Abdomen 1 View   Final Result by Krystal Clark., Clenton Pare, MD (07/12 1340)      The weighted tip feeding tube extends into the gastric body.      The limited abdomen is otherwise unremarkable. The left lung nodule is unchanged and as previously described.            XR Abdomen 1 View   Final Result by Krystal Clark., Clenton Pare, MD (07/12 1250)      A weighted tip feeding tube with  stylet in place extends to the gastric body.      The limited abdomen is otherwise unremarkable.      A left lower lobe chest nodule is as previously described.            TransTHORACIC echo (TTE) complete   Final Result by Wardell Heath, MD (07/12 1147)      XR Chest 1 View   Final Result by Alvera Novel, MD (07/12 6010)   Compared to 11/04/2020, an endotracheal tube has been placed, extending just  below the thoracic inlet, as expected.      There is new atelectasis or aspiration in the left lung base. There is a nodule in the left base, as before.      Heart size is normal and unchanged. The aorta is calcified, indicating atherosclerosis.      There are surgical clips in the neck on the left.      IR Gastrojejunostomy (G-J)  tube Placement    (Results Pending)      TTE 7/12  Conclusion  Normal left ventricular size and wall thickness, and global systolic function  (2D biplane EF 57%). Mid-septum is mildly aneurysmal with scar and mild  hypokinesis of the mid-to-distal anterior wall consistent with prior LAD  infarction. Diastolic function consistent with impaired relaxation.    Trileaflet aortic valve with sclerosis. No regurgitation.    Mildly thickened MV leaflets with mild posterior mitral annular  calcifications. No regurgitation.    Normal right ventricular size and systolic function.    Unable to estimate right atrial pressure in the setting of positive pressure  ventilation and insufficient TR jet to estimate PA systolic pressure.    No pericardial effusion.    No prior study available for comparison.          ASSESSMENT/PLAN      GLADE STRAUSSER is a 65 year old male with hx CAD, cirrhosis, HTN, recurrent laryngeal cancer who presented with airway compromise at OSH s/p intubation transferred here to [facility] now s/p tracheostomy on 7/12.    #Laryngeal cancer s/p tracheostomy  - management per primary team  - NGT in place. 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 Currently sober. Currently compensated. Hx of ascites well controlled on diuretics.  - hold home lasix 443m spironolactone 10024ms he's currently not taking PO and tube feeds have yet to be started  - Lactulose 20 grams daily    #R  acromioclavicular septic arthritis   #Group B Strep Septicemia   Hx of septic arthritis and group G bacteremia s/p removal of distal third clavicle and debridement on 05/15/20. 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 He received 2 months of IV abx previously and was seen in ID clinic on 10/09/20 who recommended 2 additional months of PO cefdinir 300m76mD.  - currently on IV CTX. okay to restart cefdinir 300mg28m tomorrow.     #Tobacco use disorder  - please order nicotine patch 21 mg    #GERD  - Omeprazole 20mg 58my     Thank you for involving us in [place] patient's care. Please reach Primary Contact in [place] with additional questions/concerns. We will continue to follow peripherally

## 2020-11-07 DIAGNOSIS — E785 Hyperlipidemia, unspecified: Secondary | ICD-10-CM

## 2020-11-07 DIAGNOSIS — I10 Essential (primary) hypertension: Secondary | ICD-10-CM

## 2020-11-07 LAB — GLUCOSE POC, ~~LOC~~
Glucose (POC): 114 mg/dL (ref 62–125)
Glucose (POC): 120 mg/dL (ref 62–125)
Glucose (POC): 126 mg/dL — ABNORMAL HIGH (ref 62–125)
Glucose (POC): 135 mg/dL — ABNORMAL HIGH (ref 62–125)

## 2020-11-07 LAB — COVID-19 CORONAVIRUS QUALITATIVE PCR: COVID-19 Coronavirus Qual PCR Result: NOT DETECTED

## 2020-11-07 LAB — CBC (HEMOGRAM)
Hematocrit: 38 % (ref 38.0–50.0)
Hemoglobin: 11.8 g/dL — ABNORMAL LOW (ref 13.0–18.0)
MCH: 24.8 pg — ABNORMAL LOW (ref 27.3–33.6)
MCHC: 31.1 g/dL — ABNORMAL LOW (ref 32.2–36.5)
MCV: 80 fL — ABNORMAL LOW (ref 81–98)
Platelet Count: 38 10*3/uL — ABNORMAL LOW (ref 150–400)
RBC: 4.75 10*6/uL (ref 4.40–5.60)
RDW-CV: 21.7 % — ABNORMAL HIGH (ref 11.6–14.4)
WBC: 5.83 10*3/uL (ref 4.3–10.0)

## 2020-11-07 LAB — BASIC METABOLIC PANEL
Anion Gap: 7 (ref 4–12)
Anion Gap: 5 (ref 4–12)
Calcium: 7.7 mg/dL — ABNORMAL LOW (ref 8.9–10.2)
Calcium: 8.3 mg/dL — ABNORMAL LOW (ref 8.9–10.2)
Carbon Dioxide, Total: 23 meq/L (ref 22–32)
Carbon Dioxide, Total: 28 meq/L (ref 22–32)
Chloride: 105 meq/L (ref 98–108)
Chloride: 103 meq/L (ref 98–108)
Creatinine: 0.69 mg/dL (ref 0.51–1.18)
Creatinine: 0.73 mg/dL (ref 0.51–1.18)
Glucose: 121 mg/dL (ref 62–125)
Glucose: 121 mg/dL (ref 62–125)
Potassium: 4 meq/L (ref 3.6–5.2)
Potassium: 4.2 meq/L (ref 3.6–5.2)
Sodium: 135 meq/L (ref 135–145)
Sodium: 136 meq/L (ref 135–145)
Urea Nitrogen: 20 mg/dL (ref 8–21)
Urea Nitrogen: 19 mg/dL (ref 8–21)
eGFR by CKD-EPI 2021: >60 mL/min/{1.73_m2} (ref >59)
eGFR by CKD-EPI 2021: >60 mL/min/{1.73_m2} (ref >59)

## 2020-11-07 LAB — PHOSPHATE
Phosphate: 1.6 mg/dL — ABNORMAL LOW (ref 2.5–4.5)
Phosphate: 2.9 mg/dL (ref 2.5–4.5)

## 2020-11-07 LAB — MAGNESIUM
Magnesium: 1.7 mg/dL — ABNORMAL LOW (ref 1.8–2.4)
Magnesium: 2.6 mg/dL — ABNORMAL HIGH (ref 1.8–2.4)

## 2020-11-07 MED ORDER — SODIUM CHLORIDE 0.9 % IV SOLN
3.0000 g | Freq: Once | INTRAVENOUS | Status: AC
Start: 2020-11-07 — End: 2020-11-07
  Administered 2020-11-07: 3 g via INTRAVENOUS
  Filled 2020-11-07: qty 6

## 2020-11-07 MED ORDER — THIAMINE MONONITRATE 100 MG OR TABS
100.0000 mg | ORAL_TABLET | Freq: Every day | ORAL | Status: DC
Start: 2020-11-07 — End: 2020-11-12
  Administered 2020-11-07 – 2020-11-12 (×6): 100 mg via GASTROSTOMY
  Filled 2020-11-07 (×6): qty 1

## 2020-11-07 MED ORDER — ENOXAPARIN SODIUM 40 MG/0.4ML IJ SOSY
40.0000 mg | PREFILLED_SYRINGE | Freq: Every evening | INTRAMUSCULAR | Status: DC
Start: 2020-11-07 — End: 2020-11-12

## 2020-11-07 MED ORDER — SODIUM CHLORIDE 0.9 % IV SOLN
40.0000 meq | Freq: Once | INTRAVENOUS | Status: AC
Start: 2020-11-07 — End: 2020-11-07
  Administered 2020-11-07: 40 meq via INTRAVENOUS
  Filled 2020-11-07: qty 9.09

## 2020-11-07 NOTE — Nursing Note (Signed)
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  Tolerating trickle TF.  Strong productive cough, clearing secretions via trach and mouth.  Secretions continue to be thick, old-blood colored.  Voiding in urinal.  Reports poor sleep for the past several months.

## 2020-11-07 NOTE — Progress Notes (Signed)
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    SUBJECTIVE:  Right forearm IV disconnected and lost about 200cc of blood otherwise AFVSS.  Troponin downtrending and NSR overnight with telemonitoring.      OBJECTIVE:  Neck: 6-0 shiley sutured in place, mucus around flanges. Inner cannula in place. Tolerating secretions and suctions.   Resp: Comfortable on RA    Notable Labs:  Mg - 1.7  Phos - 1.6  Ca - 7.7   Plts - 38 (down from 53)    ASSESSMENT/PLAN:  65 yo M w/ a history of alcohol cirrhosis, CAD, and prior L oropharyngeal cancer treated w/ surgical resection Annett Gula 2011) and chemoradiation. 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 POD1 tracheostomy, stable.    POD#2:  #laryngeal cancer s/p tracheostomy  - HTC as tolerated  - RT for trach teaching  - Discharge RT to begin coordinating outpatient supplies    #malnutrsition  #dysphagia  - NG tube and currently on trickle feeds   - Advance tube feeds to goal today  - Monitor electrolytes for refeeding syndrome   - Replete Mg and Phos  - IR to place G-tube (7/18)  - Begin coordinating outpatient tube feed supplies    #Cirrhosis  - Monitor platelet count    #DVT Prophylaxis  - Switched from SubQ heparin to enoxaparin (7/14)    #PT/OT  - Consult to work with patient    #Dispo  - SW to assist in discussing disposition to home vs Resp SNF  - Anticipate d/c with trach and gtube    Tania Ade, MD  Resident  PGY-1  Otolaryngology - Head and Neck Surgery

## 2020-11-07 NOTE — Nursing Note (Signed)
Patient Summary  65 y/o M with recurrent tonsillar CA and recently started radiation tx who presented with obstructive laryngeal mass at OSH s/p intubation transferred here to 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 Advancing TFs 10 Q6 per order. Ambulated x 2 this shift.

## 2020-11-07 NOTE — Progress Notes (Signed)
Physical Therapy Evaluation    Cody Clark  [id number]     11/07/2020  Treatment Duration (min): 28 Mins    Patient Active Problem List   Diagnosis   . Alcoholic cirrhosis of liver with ascites ([place])   . Chronic systolic heart failure ([place])   . Coronary artery disease due to calcified coronary lesion   . Squamous cell carcinoma in situ   . Squamous cell carcinoma of larynx (HCC)       No past medical history on file.    No past surgical history on file.    Home Living  Home Living  Type of Home: Apartment  Entry Stairs: none Management consultant)  Lives With: Alone  Mobility Equipment Owned: Four wheeled walker or rollator (rollator)    Prior Level of Function  Prior Function  Prior Level of Function: Independent with all functional mobility and ADLs  Details on Prior Level of Function: Amb c rollator  Assistance Available at Home: None (reported he is "working on finding help")    Extremity Assessments  RUE Assessment  RUE Assessment: Within Functional Limits              LUE Assessment  LUE Assessment: Within Functional Limits              RLE Assessment  RLE Assessment: Exceptions to [facility]        RLE Strength  R Hip Flexion: 4/5  R Knee Flexion: 4/5  R Knee Extension: 5/5  R Ankle Dorsiflexion: 5/5  R Ankle Plantar Flexion: 5/5  R EHL: 4/5     LLE Assessment  LLE Assessment: Within Functional Limits              Sensation  Sensation  Light Touch: No apparent deficits (tested LE light touch; intact B)  Coordination  Coordination  Movements are Fluid and Coordinated: Yes    Static Sitting Balance  Static Sitting Balance  Static Sitting-Balance Support: No upper extremity supported;Feet supported  Static Sitting Level of Assistance: Independent  Dynamic Sitting Balance  Dynamic Sitting Balance  Dynamic Sitting-Balance Support: No upper extremity supported;Feet supported  Dynamic Sitting Balance Level of Assist: Independent  Static Standing Balance  Static Standing Balance  Static Standing Balance: No upper extremity  supported  Static Standing Balance Level of Assistance: Independent  Dynamic Standing Balance  Dynamic Standing Balance  Dynamic Standing Balance: Bilateral upper extremity supported  Dynamic Standing Balance Level of Assist: Supervision / Stand by assistance  Activity Tolerance  Activity Tolerance  Endurance: Endurance does not limit participation in activity    Functional Level:  Bed Mobility:   Rolling Left: Independent  Rolling Right: Independent  Supine>Sit: Independent  Sit>Supine: Independent  Scooting: Independent  Transfers:   Sit<>Stand: Set up or clean up assistance  Bed<>Chair/WC: Set up or clean up assistance;Assistive device used   Ambulation:   Distance: 200'  Assistance Needs: Supervision / Stand by Electronics engineer Used: Four wheeled walker (rollator)      ASSESSMENT/PLAN     Pt was able to complete most functional mobility independently or only required supervision/stand by assist (mostly for management of IV lines). He has mild decrease in RLE strength, but it does not limit his mobility. Pt appears to be at or near baseline for functional mobility. He will not have assistance at home, but he does not have any mobility barriers. No further PT indicated, DC from PT.    Barriers to Discharge: no mobility barriers  Goals:  Goal: AMPAC >24  Estimated Completion Date: 11/08/20    PT Plan of Care:      PT Plan: No skilled PT/Discharge PT       Plan for Next Session: DC from PT    Other Recommendations:  PT Discharge Recommendations: No follow up PT indicated   PT Daily Recommendations: walk with rollator, 1 person assist    Ron Agee, PT

## 2020-11-07 NOTE — Progress Notes (Signed)
Progress Note     Cody SAYE Gwyndolyn Saxon") - DOB: 02-20-1956 65 year old male)  Preferred Pronouns: patient's name  Admit Date: 11/05/2020  Code Status: Full Code       CHIEF CONCERN / IDENTIFICATION:  Cody Clark is a 65 year old male with hx CAD, cirrhosis, HTN, recurrent laryngeal cancer who presented with airway compromise at OSH s/p intubation transferred here to [facility] now s/p tracheostomy on 7/12.    Medicine consulted for assistance with management of comorbidities.      SUBJECTIVE   INTERVAL HISTORY:  - denies any dyspnea. Not on diuretics, he does not feeling any weight gain or swelling. Tolerating his tube feeds. Denies any N/V, abd pain.    SCHEDULED MEDICATIONS:   .  atorvastatin, 40 mg, Daily  .  carVEDilol, 6.25 mg, BID  .  cefdinir, 300 mg, BID  .  enoxaparin, 40 mg, q HS  .  [Held By Provider] furosemide, 40 mg, Daily  .  insulin LISPRO, 0-5 units, q6h [facility]  .  lactulose, 20 g, Daily  .  nicotine, 1 patch, Daily  .  omeprazole, 20 mg, Daily empty stomach  .  [Held By Provider] spironolactone, 100 mg, Daily  .  thiamine mononitrate, 100 mg, Daily    INFUSED MEDICATIONS:       PRN MEDICATIONS:  dextrose, 50-75 mL/hr, PRN  .  dextrose, 125 mL, PRN  .  glucagon, 0.5 mg, PRN  .  glucagon, 1 mg, PRN  .  HYDROmorphone, 1-2 mg, q3h PRN  .  labetalol, 10 mg, Q2 Hours PRN  .  lactulose, 20 g, Daily PRN  .  potassium chloride ER, 20 mEq, q6h PRN  .  potassium chloride ER, 40 mEq, q6h PRN       OBJECTIVE     Vitals (Most recent in last 24 hrs)     T: 36.2 C (11/07/20 0400)  BP: 122/67 (11/07/20 0400)  HR: 66 (11/07/20 0550)  RR: 14 (11/07/20 0811)  SpO2: 98 % (11/07/20 0811) Trach mask 10 L/min 30 %  T range: Temp  Min: 36.1 C  Max: 36.4 C  Admit weight: 60.6 kg (133 lb 9.6 oz) (11/05/20 0556)  Last weight: 60.6 kg (133 lb 9.6 oz) (11/05/20 0556)       I&Os:     Intake/Output Summary (Last 24 hours) at 11/07/2020 0818  Last data filed at 11/07/2020 0700  Intake 1019.67 ml   Output 780 ml   Net 239.67 ml        Physical Exam  GENERAL: sitting in chair in NAD  EYES: Anicteric, EOMI  ENT: trach in place. NGT in place  CV: RRR. Normal S1, S2. No mrg  RESP: Unlabored breathing. CTA b/l  GI:  Abd Soft, NTND  MSK: WWP. No LE edema  Skin: warm, dry, no rashes  NEURO: Alert, oriented. Face symmetric. Moves all limbs. No asterixes  PSYCH: Pleasant, appropriate affect     Labs (last 24 hours):   Chemistries  CBC  LFT  Gases, other   135 105 20 121   11.8   AST: - ALT: -  -/-/-/-  -/-/-/-   4.0 23 0.69   5.83 >< 38  AP: - T bili: -  Lact (a): - Lact (v): -   eGFR: >60 Ca: 7.7   38   Prot: - Alb: -  Trop I: 0.05 D-dimer: -   Mg: 1.7 PO4: 1.6  ANC: -  BNP: - Anti-Xa: -     ALC: -    INR: -            ASSESSMENT/PLAN      Cody Clark is a 65 year old male with hx CAD, cirrhosis, HTN, recurrent laryngeal cancer who presented with airway compromise at OSH s/p intubation transferred here to [facility] now s/p tracheostomy on 7/12.    #Laryngeal cancer s/p tracheostomy  - management per primary team  - NGT in place. 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 Currently sober. Currently compensated. Hx of ascites well controlled on diuretics.  - hold home lasix 473m spironolactone 10080mor now. Will likely restart when his tube feeds are closer to goal rate  - cont Lactulose 20 grams daily    #Hypophosphatemia  #Hypomagnesemia  - please replete with 17m79mV Kphos and 3gm IV Mg sulfate    #R acromioclavicular septic arthritis   #Group B Strep Septicemia   Hx of septic arthritis and group G bacteremia s/p removal of distal third clavicle and debridement on 05/15/20. 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 He received 2 months of IV abx previously and was seen in ID clinic on 10/09/20 who recommended 2 additional  months of PO cefdinir 300mg35m  - cont cefdnir 300mg 14m   #Tobacco use disorder  - continue nicotine patch    #GERD  -Omeprazole 20mg d3m    #DVT ppx  - recommend holding pharmacologic ppx for platelets <50k    Thank you for involving us in [place] patient's care. Please reach Primary Contact in [place] with additional questions/concerns.We will continue to follow peripherally

## 2020-11-07 NOTE — Progress Notes (Signed)
Social worker following up on SNF referral.    Yesterday social worker sent respiratory SNF referral to [facility].  Admissions requesting for PT and OT notes.    Patient was discussed in Progression of Care rounds with Otolaryngology ARNP Tito Dine).  Ms. Dow Adolph will order PT and OT.    Social worker will send updated notes and PT/OT notes to SNF admissions.

## 2020-11-07 NOTE — Progress Notes (Signed)
11/07/20 1128   Oxygen Therapy   SpO2 99 %   Oxygen Therapy Supplemental oxygen   O2 Delivery Method Trach mask   FiO2 (%) (S)  28 %   Patient Activity At rest   ROX Index 17.68   Respiratory Assessment   Assessment Type Assess only   Resp 20   Respiratory Pattern Normal   Chest Assessment Chest expansion symmetrical   Airways   Airway LDA Tracheostomy   Surgical Airway 11/05/20 Shiley 6   Placement Date/Time: 11/05/20 1718   Surgical Airway Type: Tracheostomy  Brand: Shiley  Style: Cuffed  Size (mm): 6   Cuff Status Cuff Deflated   Site Assessment Drainage;Sutured   Site Care Dressing applied;Cleansed   Inner Cannula Care Other (Comment)  (patent)   Ties Assessment Clean;Dry;Intact;Secure   Pt remains on ATC 28% at this time with no complications. No suctioning indicated; pt has a strong PC and did not want any suctioning by RT. Trach care done. Drainage noted around trach. Lurline Idol remains sutured with cuff down. Will continue plan of care.

## 2020-11-07 NOTE — Progress Notes (Incomplete)
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 He has recently (January 2022) developed a new right laryngeal/piriform sinus cancer ([id number]) that was found to be poorly differentiated on biopsy by his local Otolaryngologist.    SUBJECTIVE:  Right forearm IV disconnected and lost about 200cc of blood otherwise AFVSS.  Troponin downtrending and NSR overnight with telemonitoring.      OBJECTIVE:  Neck: 6-0 shiley sutured in place, mucus around flanges. Inner cannula in place. Tolerating secretions and suctions.   Resp: Comfortable on RA    Notable Labs:  Mg - 1.7  Phos - 1.6  Ca - 7.7   Plts - 38 (down from 60)    ASSESSMENT/PLAN:  65 yo M w/ a history of alcohol cirrhosis, CAD, and prior L oropharyngeal cancer treated w/ surgical resection Annett Gula 2011) and chemoradiation. He has recently (January 2022) developed a new right laryngeal/piriform sinus cancer ([id number]) that was found to be poorly differentiated on biopsy by his local Otolaryngologist. POD1 tracheostomy, stable.    POD#2:  #laryngeal cancer s/p tracheostomy  - HTC as tolerated  - RT for trach teaching  - Discharge RT to begin coordinating outpatient supplies    #malnutrsition  #dysphagia  - NG tube and currently on trickle feeds   - Advance tube feeds to goal today  - Monitor electrolytes for refeeding syndrome   - Replete Mg and Phos  - IR to place G-tube (7/18)  - Begin coordinating outpatient tube feed supplies    #Cirrhosis  - Monitor platelet count    #DVT Prophylaxis  - Switched from SubQ heparin to enoxaparin (7/14)    #PT/OT  - Consult to work with patient    #Dispo  - SW to assist in discussing disposition to home vs Resp SNF  - Anticipate d/c with trach and gtube    Tania Ade, MD  Resident  PGY-1  Otolaryngology - Head and Neck Surgery

## 2020-11-08 DIAGNOSIS — K7469 Other cirrhosis of liver: Secondary | ICD-10-CM

## 2020-11-08 DIAGNOSIS — Z86718 Personal history of other venous thrombosis and embolism: Secondary | ICD-10-CM

## 2020-11-08 DIAGNOSIS — Z930 Tracheostomy status: Secondary | ICD-10-CM

## 2020-11-08 DIAGNOSIS — I214 Non-ST elevation (NSTEMI) myocardial infarction: Secondary | ICD-10-CM

## 2020-11-08 DIAGNOSIS — D696 Thrombocytopenia, unspecified: Secondary | ICD-10-CM

## 2020-11-08 LAB — CBC (HEMOGRAM)
Hematocrit: 34 % — ABNORMAL LOW (ref 38.0–50.0)
Hemoglobin: 10.5 g/dL — ABNORMAL LOW (ref 13.0–18.0)
MCH: 25.1 pg — ABNORMAL LOW (ref 27.3–33.6)
MCHC: 30.8 g/dL — ABNORMAL LOW (ref 32.2–36.5)
MCV: 81 fL (ref 81–98)
Platelet Count: 34 10*3/uL — ABNORMAL LOW (ref 150–400)
RBC: 4.19 10*6/uL — ABNORMAL LOW (ref 4.40–5.60)
RDW-CV: 21.2 % — ABNORMAL HIGH (ref 11.6–14.4)
WBC: 6.29 10*3/uL (ref 4.3–10.0)

## 2020-11-08 LAB — BASIC METABOLIC PANEL
Anion Gap: 4 (ref 4–12)
Calcium: 7.9 mg/dL — ABNORMAL LOW (ref 8.9–10.2)
Carbon Dioxide, Total: 30 meq/L (ref 22–32)
Chloride: 104 meq/L (ref 98–108)
Creatinine: 0.61 mg/dL (ref 0.51–1.18)
Glucose: 138 mg/dL — ABNORMAL HIGH (ref 62–125)
Potassium: 4.3 meq/L (ref 3.6–5.2)
Sodium: 138 meq/L (ref 135–145)
Urea Nitrogen: 17 mg/dL (ref 8–21)
eGFR by CKD-EPI 2021: >60 mL/min/{1.73_m2} (ref >59)

## 2020-11-08 LAB — PHOSPHATE
Phosphate: 2 mg/dL — ABNORMAL LOW (ref 2.5–4.5)
Phosphate: 3.7 mg/dL (ref 2.5–4.5)

## 2020-11-08 LAB — MAGNESIUM
Magnesium: 2.1 mg/dL (ref 1.8–2.4)
Magnesium: 1.8 mg/dL (ref 1.8–2.4)

## 2020-11-08 LAB — GLUCOSE POC, ~~LOC~~: Glucose (POC): 153 mg/dL — ABNORMAL HIGH (ref 62–125)

## 2020-11-08 MED ORDER — SODIUM PHOSPHATES 45 MMOLE/15ML IV SOLN
40.0000 meq | Freq: Once | INTRAVENOUS | Status: AC
Start: 2020-11-08 — End: 2020-11-08
  Administered 2020-11-08: 40 meq via INTRAVENOUS
  Filled 2020-11-08: qty 10

## 2020-11-08 NOTE — Progress Notes (Addendum)
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 He has recently (January 2022) developed a new right laryngeal/piriform sinus cancer [phone]) that was found to be poorly differentiated on biopsy by his local Otolaryngologist.    He presented to OSH with airway compromise, intubated and transferred here to [facility], now s/p trach 11/05/20    SUBJECTIVE:  - c/o R eyelid droop, states this has been going on for the last 3 weeks  - Plt down to 34 from 38  - Frustrated with the frequent finger sticks  - Phos down to 2.0  - tolerating tube feeds, not quite at goal rate  - pain well controlled  - managing tracheal secretions well with strong cough     OBJECTIVE:  BP (!) 117/55 (Patient Position: Lying)   Pulse 96   Temp 36.3 C (Temporal)   Resp 16   Ht 5' 7.4" (1.712 m)   Wt 60.6 kg (133 lb 9.6 oz)   SpO2 100%   BMI 20.68 kg/m     GEN: resting comfortably in bed, no acute distress, writing notes to communicate  HEENT:  Eye:  Very subtle R eyelid droop, PERRLA, CN V1-3 intact, CNVII intact.  Neck: 6-0 shiley sutured in place, cuff down.  Small amount of mucus around flanges. Coughing small amount of light tan mucous out trach and from mouth.  Inner cannula in place.   Resp: Comfortable on 28% humidified trach collar  CV:  Well perfused  NEURO:  Awake and alert, Ox4      Notable Labs:  Mg - 2.1  Phos - 2.0  Ca - 7.9  Plts - 34 (down from 38,  from 39)    ASSESSMENT/PLAN:  65 yo M w/ a history of alcohol cirrhosis, CAD, and prior L oropharyngeal cancer treated w/ surgical resection Annett Gula 2011) and chemoradiation. He has recently (January 2022) developed a new right laryngeal/piriform sinus cancer ([id number]) that was found to be poorly differentiated on biopsy by his local Otolaryngologist. POD1 tracheostomy, stable.  Admitted OSH with respiratory compromise, intubated and transferred to [facility], now S/p Trach  11/05/20    POD#3:  #laryngeal cancer s/p tracheostomy  - Continue HTC  - RT to assist with trach teaching  - plan trach change tomorrow 7/16 to 6-0 cuffless, please bring to bedside    #malnutrsition  #dysphagia  - Tolerating continuous tube feeds via NG tube   - continue advancing to goal today, 60cc/hr  - Monitor electrolytes for refeeding syndrome   - Replete Phos today  - IR to place G-tube (7/18)  - okay to stop Q6hr finger stick glucose checks    #Cirrhosis- appreciate med con recs  - Monitor platelet count.  Will need to be >50 prior to G-tube procedure in IR on 7/18  - continue lactulose  - Restart Spironolactone and Lasix today  - check LFTs tomorrow morning    #DVT Prophylaxis  - Continue to hold enoxaparin (plts 34 today)    #PT/OT  - Consult to work with patient    #Dispo  - SW to assist with Resp SNF placement  - Anticipate d/c with trach and gtube next week

## 2020-11-08 NOTE — Consults (Signed)
Inpatient Follow Up Nutrition Assessment      Assessment    65 yo M w/ hx of alcohol cirrhosis, CAD, and prior left oropharyngeal cancer treated w/ surgical resection (2011) and chemoradiation. He has recently (January 2022) developed a new right laryngeal/piriform sinus cancer ([id number]) that was found to be poorly differentiated on biopsy by his local Otolaryngologist. He is now POD#1 s/p tracheostomy, stable.    Reason for Consult: enteral nutrition    Diagnoses:   Patient Active Problem List   Diagnosis   . Alcoholic cirrhosis of liver with ascites ([place])   . Chronic systolic heart failure ([place])   . Coronary artery disease due to calcified coronary lesion   . Squamous cell carcinoma in situ   . Squamous cell carcinoma of larynx [facility])       Past Medical/Surgical History:   As above.    Admission Anthropometrics:  (11/05/2020  5:44 AM)  Height: 171.2 cm (5' 7.4")  Weight: 60.6 kg (133 lb 9.6 oz) -- bed scale wt   Admit BMI (Calculated): 20.72kg/m2  Percentage of IBW (Male): 52.17  [place] IBW/kg (Calculated) Male: 68.23  UBW: ~140# (63.6 kg) -- per pt  %UBW: 95%    Wt Hx:  Weight for the past 168 hrs:   Weight   11/05/20 0556 60.6 kg (133 lb 9.6 oz)      Current BMI: Body mass index is 20.68 kg/m.     Wt Readings from Last 60 Encounters:   11/05/20 60.6 kg (133 lb 9.6 oz)   01/15/20 63.8 kg (140 lb 11.2 oz)      Care Everywhere Wts:   11/04/20: 59 kg  10/21/20: 59.9 kg  10/15/20: 60.5 kg  10/11/20: 61.2 kg  06/30/20: 62 kg  04/30/20: 61.5 kg  04/05/20: 62.6 kg  01/10/20: 62.9 kg  11/13/19: 66.4 kg  10/30/19: 67.3 kg  10/07/19: 68.4 kg  08/09/19: 66.5 kg    %Wt loss:   -Wt relatively stable over past 6 + mo's per EMR. Possible 9% wt loss x 1 year (not clinically significant)   - However, based on pt's reported wt hx, he has possibly lost 5% of his body wt x 1 month (significant)       Labs:  Labs (last 24 hours):   Chemistries  CBC  LFT    138 104 17 138   10.5   AST: - ALT: -    4.3 30 0.61   6.29 >< 34  AP: - T  bili: -    eGFR: >60 Ca: 7.9   34   Prot: - Alb: -    Mg: 1.8 PO4: 3.7  ANC: -         ALC: -        Lab Results   Component Value Date    A1C 6.2 (H) 11/06/2020     No results found for: VITD    Medications:  atorvastatin, 40 mg, Per NG Tube, Daily  carVEDilol, 6.25 mg, Per Feeding Tube, BID  cefdinir, 300 mg, Oral, BID  [Held By Provider] enoxaparin, 40 mg, Subcutaneous, q HS  furosemide, 40 mg, Oral, Daily  lactulose, 20 g, Per Feeding Tube, Daily  nicotine, 1 patch, Transdermal, Daily  omeprazole, 20 mg, Per Feeding Tube, Daily empty stomach  spironolactone, 100 mg, Oral, Daily  thiamine mononitrate, 100 mg, Per Feeding Tube, Daily        - reviewed     Allergies:  NKFA    Lines/Drains/Airways/Wounds:  NGT (  7/12/222)  Trach (11/05/20)  Incision wound to neck  PI to sacrum - not staged - present on admit    Diet/TF orders:  Active Diet Orders   Nourishments    Adult tube feeding (continuous)   Diet    NPO diet NPO except for medications        Nutrition Requirements:  *Using admit wt, 60.6 kg  ENERGY: 1820-2120 kcal (30-35 kcal/kg)   PROTEIN: 79-91 g (1.3-1.5 g/kg)   FLUID: per MD       Nutrition Assessment:  Nutrition Hx:  - Brief interview today, pt fatigued, recently extubated. Pt mouths words to communicate (trach in place). Pt reports gradual decline in PO over past couple of weeks or so. Reports good appetite at home, but eating smaller portions than usual. Denies food allergies/intolerances. Home supplements include Vit D 1000 unit/day (need to confirm w/ pt if he was taking this at f/u).  - Hx alcoholic cirrhosis noted.  - Wt: pt reports UBW of ~140# (63.6 kg). Wt relatively stable over past 6 + mo's per EMR, possible 9% wt loss x 1 year (not clinically significant). However, based on pt's reported wt hx, he has possibly lost 5% of his body wt x 1 month (significant).    FEN:  - NPO, s/p trach  - NGT in place, Feeds advancing towards goal. RN held x past 30 min 2/2 loose stool and pt up to br w/ assist.  -  Per MD note, plan to c/s IR/GI for PEG placement   - Received ave of 1050 ml of feeds in past 48-72 hrs = 76% goal   GI:  - x4 stools in past 24 hrs  - No N/V    Skin:  - PI to sacrum - not staged, present on admit    Labs:  - Phos 2  - A1c (11/06/20): 6.2% (pre-DM)   - RDW 21.2+, MCHC 31.7-    Nutrition-Focused Physical Assessment:  Muscle Wasting - Temporalis Moderate or Severe, Muscle Wasting - Clavicle Moderate or Severe, Muscle Wasting - Deltoid Moderate or Severe, Muscle Wasting - Scapula Moderate or Severe, Muscle Wasting - Thigh Moderate, Muscle Wasting - Calf Moderate, Subcutaneous Fat Loss - Orbital Moderate and Subcutaneous Fat Loss - Tricep Moderate    Edema: none noted      Evaluation of Nutritional Status    Nutrition Diagnosis: Malnutrition (moderate, possibly severe), related to decreased PO intake  as evidenced by diet recall (<75% of EER for >7 days) and NFPE (>/= moderate loss of muscle & subcutaneous fat)    Interventions:  Chart reviewed., Discussed status, point of care/goals with patient., Discussed current intake and tolerance with patient. and Adjusted EN order.    Goals of Interventions:  To support healing/repletion. and To support anabolism.    Plan:  Follow nutrition support plan., Monitor oral intake and tolerance. and Monitor nutrition lab trends, weight trends.    Recommendations:  PO  -- ADAT per OTO/SLP (currently NPO)    Nutrition Support  -- Continuous feeds for now, work up to goal  Jevity 1.5 @ 10 mL/hr, advance by 10 mL/hr Q6-8H to goal rate @ 60 mL/hr   (= 1320 mL TF, 1980 kcal, 84 g Pro, 285 g CHO, 1 L FW)  *Calculated over 22 hrs to allow for interruptions for care  Minimum 30 mL FW flush q 4 hrs while TF are running to maintain patency    Labs / Refeeding Risk  *Pt is at mod-high risk  for refeeding syndrome: Continue Phos replacement  -- Check BMP + Mag + Phos Q12H for first 1-2 days of EN, followed by daily checks until stable.   -- Aggressive lyte repletion PRN   --  Thiamine 100 mg pft daily x 7 days  -- Advance EN more cautiously if s/sx of refeeding    Vitamins/Minerals  -- Daily liquid MVI until EN is maintained at goal rate  -- Thiamine 100 mg pft daily x 7 days     Monitoring  -- Monitor TF intakes/tolerance; BMs; Lytes/BG; Wt trends    Referral  -- Referral to Home Infusion needed if pt is expected to eventually d/c home w/ nutrition support.       RD following.   See treatment team for contact.  Weekend pager: [phone]  Hope Budds RD, CD

## 2020-11-08 NOTE — Nursing Note (Signed)
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

## 2020-11-08 NOTE — Progress Notes (Signed)
*   Final Report *  Home Infusion Note        HOME INFUSION THERAPY PLAN:   Asked to assist with coordination of: TF  Reason for Therapy:   Length of Therapy:   Discharge Date:   Person(s) discussed with: Provider    Patient's Insurance: Medicare/Medicaid    Insurance Covers:   Home infusions.        REFERRALS AND SUPPORT:   Patient Referred To:   [place] office (M-F): [phone], [phone], Fax# [phone]      FOLLOW-UP:   Items to be completed by Medical team  Home tube feeding orders with "[facility]". contact information for the prescriber following the home infusion therapy(ies)    Items to be completed by Home Infusion services  Home infusion company will contact patient to arrange teaching.  Fax needed documents/information to home infusion company.       Izell Carolina, PharmD  Home Infusion Pharmacist  Phone: [phone]  Hours: Monday to Friday [phone]    Weekends please contact social worker

## 2020-11-08 NOTE — Progress Notes (Signed)
11/08/20 0456   Oxygen Therapy   SpO2 100 %   Oxygen Therapy Supplemental oxygen   O2 Delivery Method Trach mask   FiO2 (%) 28 %   Patient Activity At rest   ROX Index 22.32   Respiratory Assessment   Assessment Type Assess only   Resp 16   Respiratory Pattern Normal   Chest Assessment Chest expansion symmetrical   Surgical Airway 11/05/20 Shiley 6   Placement Date/Time: 11/05/20 1718   Surgical Airway Type: Tracheostomy  Brand: Shiley  Style: Cuffed  Size (mm): 6   Cuff Status Cuff Deflated   Site Assessment Drainage;Sutured   Site Care Dressing applied   [place] Other (Comment)  (patent)   Ties Assessment Intact   Patient on aerosol trach collar overnight. Trach care done. RCP will continue to follow.

## 2020-11-08 NOTE — Progress Notes (Signed)
Occupational Therapy Evaluation Note      Patient Name: Cody Clark  MRN: [id number]  Today's Date: 11/08/2020  Treatment Duration (min): 24 Minutes    Home Living  Home Living  Type of Home: Apartment  Entry Stairs: none Management consultant)  Lives With: Alone  Bathroom Shower/Tub: Theatre stage manager Owned: Four wheeled walker or [place]: Grab bars in shower    Prior Function  Prior Function  BADLs: Independent  IADLs: Independent  Functional Mobility: Independent  Assistance Available at Home: None    ADL and Functional Transfer Status reflective of last 24 hours  Eating: Set up or clean up assistance     Grooming: Set up or clean up assistance     Oral Hygiene: Set up or clean up assistance     Upper Body Dressing: Minimum Assist (less than 25% help needed) (due to line mgmt)     Lower Body Dressing: Set up or clean up assistance     Putting on/taking off footwear: Set up or clean up assistance     Toileting: Supervision / Stand by assistance             Bed Mobility  Rolling Left: Independent  Rolling Right: Independent  Supine>Sit: Independent  Sit>Supine: Independent  Scooting: Independent    Transfers  Sit<>Stand: Independent  Bed<>Chair/WC: Independent    Functional Mobility  Distance: 300'  Assistance Needs: Supervision / Stand by assistance  Functional Mobility Comments: using [phone] (assist for line mgmt)      Provided edu regarding activity recommendations and role of OT    See flowsheet for objective data reflective of today's session.    Assessment  Impact on Function and Participation: Jace is doing well s/p trach. He is at or very near his baseline and demo'd good safety awareness. Pt does live alone and reports that he does not have any assistance. Per pt and chart, plan is to d/c to a respiratory SNF. Pt does not currently have any acute OT needs.    Barriers to Discharge: Decreased caregiver support  Evaluation/Treatment  Tolerance: Patient tolerated treatment well  OT Summary for Next Shift: n/a    Discharge Recommendations  OT Discharge Recommendations: No follow up OT indicated, Assistance with  Assistance with Details: IADLs       Equipment Recommended:  (pt has appropriate DME)    Goals                                Laverle Patter, OT  11/08/2020

## 2020-11-08 NOTE — Progress Notes (Addendum)
11/08/20 1850   Oxygen Therapy   SpO2 95 %   Oxygen Therapy None (Room air)   O2 Delivery Method Trach mask   FiO2 (%) 21 %   O2 Flow Rate (L/min) 10 L/min   Patient Activity At rest   ROX Index 28.27   Respiratory Assessment   Assessment Type Assess only   Resp 16   Respiratory Pattern Normal   Chest Assessment Chest expansion symmetrical   Cough Productive;Strong   Suction No   Surgical Airway 11/05/20 Shiley 6   Placement Date/Time: 11/05/20 1718   Surgical Airway Type: Tracheostomy  Brand: Shiley  Style: Cuffed  Size (mm): 6   Cuff Status Cuff Deflated   Site Assessment Sutured;Dry;Red   Site Care Cleansed;Dried   Inner Cannula Care Cleansed/dried   Ties Assessment Intact     Patient remains on aerosol trach collar and tolerating well at this time. Crackle/diminished breath sound. Patient has strong spontaneous cough and is able to clear large amount of clear/tan/blood streaked - old secretion. Trach care was done. Trach care teaching was done. Patient will have trach change tomorrow by OTO. Ambu-bag with mask and proper set-up, obturator, spare trach and supplies are at the bedside. RCP will continue to monitor and support.

## 2020-11-08 NOTE — Nursing Note (Signed)
Patient Summary  65 y/o M with recurrent tonsillar CA and recently started radiation tx who presented with obstructive laryngeal mass at OSH s/p intubation transferred here to [facility] now s/p tracheostomy on 7/12    PMH: hx CAD, MI s/p stents 09/2019, HFpEF 57%, EtOH cirrhosis, HTN, Group B strep septicemia r/t R clavicle/spine osteomyletis, L2-3 discitis with epidural phlegmon and abscess (currently on long-term abx)    PSH: R clavicle debridement 04/2020, L oropharyngeal resx (Futran, 2011) +chemoradiation  Edited by: Sheran Fava, RN at 11/07/2020 0134    Illness Severity  Stable  Edited byRenato Battles, [place] at 11/05/2020 2214    VS, TF Jevity 1.5 @goal , HTC @21 %, strong productive cough. Pt WORKED WITH pt/ot and ambulated around unit. Plan is for G tube procedure Monday. Contrast and instructions for Sunday prep directions at bedside. Per OTO PT will be discharged home Tuesday. Continue to monitor.

## 2020-11-08 NOTE — Progress Notes (Signed)
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 He has recently (January 2022) developed a new right laryngeal/piriform sinus cancer ([id number]) that was found to be poorly differentiated on biopsy by his local Otolaryngologist.    He presented to OSH with airway compromise, intubated and transferred here to [facility], now s/p trach 11/05/20    SUBJECTIVE:  - AFVSS   - NAEON  - Plt down to 33 (34 <- 38)  - Pain well controlled  - Continuous tube feeds at goal rate (60cc/hr)    OBJECTIVE:  BP 111/64 (BP Location: Right arm, Patient Position: Lying)   Pulse 63   Temp 36.4 C (Temporal)   Resp 16   Ht 5' 7.4" (1.712 m)   Wt 60.6 kg (133 lb 9.6 oz)   SpO2 99%   BMI 20.68 kg/m     GEN: resting comfortably in bed, no acute distress, writing notes to communicate  HEENT:  Eye:  Very subtle R eyelid droop, PERRLA, CN V1-3 intact, CNVII intact.    Neck: 6-0 shiley cuffed sutures removed and replaced with a 6-0 shiley cuffless. Small amount of mucus around flanges. Tolerates suctioning well. Trach stoma is without bleeding. Tolerated trach change well.   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 He has recently (January 2022) developed a new right laryngeal/piriform sinus cancer ([id number]) that was found to be poorly differentiated on biopsy by his local Otolaryngologist. POD1 tracheostomy, stable.  Admitted OSH with respiratory compromise, intubated and transferred to [facility], now S/p Trach 11/05/20    POD#4:  #laryngeal cancer s/p tracheostomy  - Continue HTC  - RT to assist with trach teaching  - Trach changed to  6.0 Shiley Cuffless    #malnutrition  - Monitor for re-feeding syndrome and replete as necessary  - K > 4, Mg > 2 and Phos > 3    #dysphagia  - Tolerating continuous tube feeds via NG tube   - Advance to bolus feeds (7/16)   - RD to place recs and orders in  - Monitor electrolytes for refeeding syndrome   - Replete Phos today  - IR to place G-tube (7/18)   - Plts needs to be > 50K   - Med Con recs replacing Plts on 07/18 at 1 am. Will have 1U transfused, have labs  checked and then re-transfuse again afterwards if Plts still < 50K.  - Discontinue Q6hr finger stick glucose checks    #Cirrhosis- appreciate med con recs  - Monitor platelet count.  Will need to be >50 prior to G-tube procedure in IR on 7/18  - continue lactulose  - Restart Spironolactone and Lasix (07/15)    #DVT Prophylaxis  - Continue to hold enoxaparin (plts 34 today)    #PT/OT  - Consult to work with patient    #Dispo  - Home vs. Resp SNF (SNF less likely)  - Anticipate d/c with trach and gtube next week    Tania Ade, MD  Resident  PGY-1  Otolaryngology - Head and Neck Surgery

## 2020-11-08 NOTE — Progress Notes (Signed)
Progress Note     GORDEN Clark Cody Clark") - DOB: 06-19-1955 65 year old male)  Preferred Pronouns: patient's name  Admit Date: 11/05/2020  Code Status: Full Code       CHIEF CONCERN / IDENTIFICATION:  Cody Clark is a 65 year old male with hx CAD, cirrhosis, HTN, recurrent laryngeal cancer who presented with airway compromise at OSH s/p intubation transferred here to [facility] now s/p tracheostomy on 7/12.    Medicine consulted for assistance with management of comorbidities.      SUBJECTIVE   INTERVAL HISTORY:    - feels okay this morning. He thinks he is gaining a little bit of fluid in his abdomen. He needs to have BM and is requesting commode   - breathing feels stable. Denies dizziness, lightheadedness. Denies any bleeding.     SCHEDULED MEDICATIONS:   .  atorvastatin, 40 mg, Daily  .  carVEDilol, 6.25 mg, BID  .  cefdinir, 300 mg, BID  .  [Held By Provider] enoxaparin, 40 mg, q HS  .  [Held By Provider] furosemide, 40 mg, Daily  .  insulin LISPRO, 0-5 units, q6h [facility]  .  lactulose, 20 g, Daily  .  nicotine, 1 patch, Daily  .  omeprazole, 20 mg, Daily empty stomach  .  sodium phosphate, 40 mEq, Once  .  [Held By Provider] spironolactone, 100 mg, Daily  .  thiamine mononitrate, 100 mg, Daily    INFUSED MEDICATIONS:       PRN MEDICATIONS:  dextrose, 50-75 mL/hr, PRN  .  dextrose, 125 mL, PRN  .  glucagon, 0.5 mg, PRN  .  glucagon, 1 mg, PRN  .  HYDROmorphone, 1-2 mg, q3h PRN  .  labetalol, 10 mg, Q2 Hours PRN  .  lactulose, 20 g, Daily PRN       OBJECTIVE     Vitals (Most recent in last 24 hrs)     T: 36.3 C (11/08/20 0446)  BP: (!) 117/55 (11/08/20 0446)  HR: 96 (11/08/20 0446)  RR: 16 (11/08/20 0456)  SpO2: 100 % (11/08/20 0456) Trach mask   28 %  T range: Temp  Min: 36.2 C  Max: 36.7 C  Admit weight: 60.6 kg (133 lb 9.6 oz) (11/05/20 0556)  Last weight: 60.6 kg (133 lb 9.6 oz) (11/05/20 0556)       I&Os:     Intake/Output Summary (Last 24 hours) at 11/08/2020 0755  Last data filed at 11/08/2020  0600  Intake 925.09 ml   Output 750 ml   Net 175.09 ml       Physical Exam  GENERAL: laying in bed in NAD  EYES: Anicteric, EOMI  ENT: trach in place. NGT in place  CV: RRR. Normal S1, S2. No mrg  RESP: Unlabored breathing. CTA b/l  GI:  Abd Soft, NTND  MSK: WWP. No LE edema  Skin: warm, dry, no rashes  NEURO: Alert, oriented. Face symmetric. Moves all limbs. No asterixes  PSYCH: Pleasant, appropriate affect     Labs (last 24 hours):   Chemistries  CBC  LFT  Gases, other   138 104 17 138   10.5   AST: - ALT: -  -/-/-/-  -/-/-/-   4.3 30 0.61   6.29 >< 34  AP: - T bili: -  Lact (a): - Lact (v): -   eGFR: >60 Ca: 7.9   34   Prot: - Alb: -  Trop I: - D-dimer: -   Mg:  2.1 PO4: 2.0  ANC: -     BNP: - Anti-Xa: -     ALC: -    INR: -            ASSESSMENT/PLAN      Cody Clark is a 64 year old male with hx CAD, cirrhosis, HTN, recurrent laryngeal cancer who presented with airway compromise at OSH s/p intubation transferred here to [place] now s/p tracheostomy on 7/12.    #Laryngeal cancer s/p tracheostomy  - management per primary team  - NGT in place. Is planned for GJ tube on 7/18    #HTN  #HLD  #CAD  NSTEMI in 2019 and STEMI in 2021 with stent placement in June 2021  - cont atorvastatin, carvedilol 6.25mg BID. 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 Currently sober. Currently compensated. Hx of ascites well controlled on lasix 40/spironolactone 100  - restart lasix at 20mg and spironolactone 50mg  - cont Lactulose 20 grams daily    #Hypophosphatemia  #Hypomagnesemia  - cont to replete    #R acromioclavicular septic arthritis   #Group B Strep Septicemia   Hx of septic arthritis and group G bacteremia s/p removal of distal third clavicle and debridement on 05/15/20. 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 He received 2 months of IV abx previously and was seen in ID clinic on 10/09/20 who recommended 2 additional months of PO cefdinir 300mg  BID  - cont cefdnir 300mg BID    #Tobacco use disorder  - continue nicotine patch    #GERD  -Omeprazole 20mg daily    #Thrombocytopenia  Suspect in the setting of his underlying cirrhosis. No evidence of active bleeding. CT A/P done on 05/14/20 with evidence of splenomegaly  - hold ASA and pharmacology DVT ppx given platelets <50k  - daily CBC  - check B12, folate    Thank you for involving us in this patient's care. Please reach Primary Contact in CORES with additional questions/concerns.We will continue to follow peripherally

## 2020-11-08 NOTE — Progress Notes (Signed)
Social worker follow-up with SNF placement.    Patient was discussed in Progression of Care rounds with Otolaryngology ARNP Tito Dine).      Social worker sent updated notes including PT and OT evaluation to respiratory SNF [facility]).  [facility] expressed interest in potential admissions.  Admissions requested that social worker send updated notes on Monday morning for them to reassess patient.    On Monday, social worker will send updates to [facility] and follow-up with [facility] on discharge needs.    Social worker will continue to follow for discharge planning and care coordination, and advice the team appropriately.

## 2020-11-09 DIAGNOSIS — Z720 Tobacco use: Secondary | ICD-10-CM

## 2020-11-09 DIAGNOSIS — K219 Gastro-esophageal reflux disease without esophagitis: Secondary | ICD-10-CM

## 2020-11-09 LAB — PHOSPHATE
Phosphate: 2 mg/dL — ABNORMAL LOW (ref 2.5–4.5)
Phosphate: 2.4 mg/dL — ABNORMAL LOW (ref 2.5–4.5)

## 2020-11-09 LAB — CBC (HEMOGRAM)
Hematocrit: 34 % — ABNORMAL LOW (ref 38.0–50.0)
Hemoglobin: 10.6 g/dL — ABNORMAL LOW (ref 13.0–18.0)
MCH: 25.2 pg — ABNORMAL LOW (ref 27.3–33.6)
MCHC: 31.4 g/dL — ABNORMAL LOW (ref 32.2–36.5)
MCV: 80 fL — ABNORMAL LOW (ref 81–98)
Platelet Count: 33 10*3/uL — ABNORMAL LOW (ref 150–400)
RBC: 4.21 10*6/uL — ABNORMAL LOW (ref 4.40–5.60)
RDW-CV: 21.3 % — ABNORMAL HIGH (ref 11.6–14.4)
WBC: 6.27 10*3/uL (ref 4.3–10.0)

## 2020-11-09 LAB — COMPREHENSIVE METABOLIC PANEL
ALT (GPT): 11 U/L (ref 10–48)
AST (GOT): 20 U/L (ref 9–38)
Albumin: 2.7 g/dL — ABNORMAL LOW (ref 3.5–5.2)
Alkaline Phosphatase (Total): 101 U/L (ref 37–159)
Anion Gap: 8 (ref 4–12)
Bilirubin (Total): 1.2 mg/dL (ref 0.2–1.3)
Calcium: 7.5 mg/dL — ABNORMAL LOW (ref 8.9–10.2)
Carbon Dioxide, Total: 25 meq/L (ref 22–32)
Chloride: 104 meq/L (ref 98–108)
Creatinine: 0.6 mg/dL (ref 0.51–1.18)
Glucose: 147 mg/dL — ABNORMAL HIGH (ref 62–125)
Potassium: 4.1 meq/L (ref 3.6–5.2)
Protein (Total): 5.2 g/dL — ABNORMAL LOW (ref 6.0–8.2)
Sodium: 137 meq/L (ref 135–145)
Urea Nitrogen: 16 mg/dL (ref 8–21)
eGFR by CKD-EPI 2021: >60 mL/min/{1.73_m2} (ref >59)

## 2020-11-09 LAB — FOLATE & VITAMIN B12
Folate, SRM: 6.6 ng/mL (ref >5.8)
Vitamin B12 (Cobalamin): 775 pg/mL (ref 180–914)

## 2020-11-09 LAB — MAGNESIUM
Magnesium: 1.9 mg/dL (ref 1.8–2.4)
Magnesium: 1.7 mg/dL — ABNORMAL LOW (ref 1.8–2.4)

## 2020-11-09 MED ORDER — DIPHENHYDRAMINE HCL 25 MG OR CAPS
25.0000 mg | ORAL_CAPSULE | ORAL | Status: DC | PRN
Start: 2020-11-09 — End: 2020-11-12
  Administered 2020-11-09 – 2020-11-10 (×2): 25 mg via GASTROSTOMY
  Filled 2020-11-09 (×2): qty 1

## 2020-11-09 MED ORDER — PILL CRUSHER MISC
Status: AC
Start: 2020-11-09 — End: 2020-11-10
  Filled 2020-11-09: qty 1

## 2020-11-09 MED ORDER — SODIUM PHOSPHATES 45 MMOLE/15ML IV SOLN
40.0000 meq | Freq: Once | INTRAVENOUS | Status: AC
Start: 2020-11-09 — End: 2020-11-09
  Administered 2020-11-09: 40 meq via INTRAVENOUS
  Filled 2020-11-09: qty 10

## 2020-11-09 MED ORDER — PILL CRUSHER MISC
1.0000 | Freq: Once | Status: AC
Start: 2020-11-09 — End: 2020-11-09
  Administered 2020-11-09: 1
  Filled 2020-11-09: qty 1

## 2020-11-09 NOTE — Nursing Note (Signed)
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 HTC 21%, Clear over dim lung sounds. Good productive cough. Thick, tan secretions from trach. Trach changed to 6.0 shilly cuffless. Bolus TF Jevity 1.5 W/100cc FW flushes before and after feeds. PT was transferred to [facility] and report given to [facility]. PT scheduled for PEG tube procedure on Monday 7/19. Contrast and NPO directions given to RN Jodie. Plan is for PT  To DC home Tuesday 7/20. Continue to monitor.

## 2020-11-09 NOTE — Progress Notes (Signed)
Progress Note     DEWITTE VANNICE Gwyndolyn Saxon") - DOB: 04-Sep-1955 65 year old male)  Preferred Pronouns: patient's name  Admit Date: 11/05/2020  Code Status: Full Code       CHIEF CONCERN / IDENTIFICATION:  Cody Clark is a 65 year old male with hx CAD, cirrhosis, HTN, recurrent laryngeal cancer who presented with airway compromise at OSH s/p intubation transferred here to [facility] now s/p tracheostomy on 7/12.    Medicine consulted for assistance with management of comorbidities.      SUBJECTIVE   INTERVAL HISTORY:  - had 6BM yesterday, first 2 were solid, then the rest more liquidy  - no BM yet this AM, feels otherwise good  - Denies any dyspnea. Some tenderness around trach site.    SCHEDULED MEDICATIONS:   .  atorvastatin, 40 mg, Daily  .  carVEDilol, 6.25 mg, BID  .  cefdinir, 300 mg, BID  .  [Held By Provider] enoxaparin, 40 mg, q HS  .  furosemide, 40 mg, Daily  .  lactulose, 20 g, Daily  .  nicotine, 1 patch, Daily  .  omeprazole, 20 mg, Daily empty stomach  .  spironolactone, 100 mg, Daily  .  thiamine mononitrate, 100 mg, Daily    INFUSED MEDICATIONS:       PRN MEDICATIONS:  dextrose, 50-75 mL/hr, PRN  .  dextrose, 125 mL, PRN  .  glucagon, 0.5 mg, PRN  .  glucagon, 1 mg, PRN  .  HYDROmorphone, 1-2 mg, q3h PRN  .  labetalol, 10 mg, Q2 Hours PRN  .  lactulose, 20 g, Daily PRN       OBJECTIVE     Vitals (Most recent in last 24 hrs)     T: 36.3 C (11/09/20 0800)  BP: (!) 147/59 (11/09/20 0800)  HR: 75 (11/09/20 0800)  RR: 16 (11/09/20 0850)  SpO2: 94 % (11/09/20 0850) Room air  T range: Temp  Min: 36.3 C  Max: 36.8 C  Admit weight: 60.6 kg (133 lb 9.6 oz) (11/05/20 0556)  Last weight: 60.6 kg (133 lb 9.6 oz) (11/05/20 0556)       I&Os:     Intake/Output Summary (Last 24 hours) at 11/09/2020 1005  Last data filed at 11/09/2020 0800  Intake 1380 ml   Output 450 ml   Net 930 ml       Physical Exam  GENERAL: laying in bed in NAD  EYES: Anicteric, EOMI  ENT: trach in place. NGT in place  CV: RRR. Normal S1, S2. No  mrg  RESP: Unlabored breathing. CTA b/l  GI:  Abd Soft, NTND  MSK: WWP. No LE edema  Skin: warm, dry, no rashes  NEURO: Alert, oriented. Face symmetric. Moves all limbs. No asterixes  PSYCH: Pleasant, appropriate affect     Labs (last 24 hours): {Vanishing Tip  Labs Since Admission  Glycemic Control  :999}  Chemistries  CBC  LFT  Gases, other   137 104 16 147   10.6   AST: 20 ALT: 11  -/-/-/-  -/-/-/-   4.1 25 0.60   6.27 >< 33  AP: 101 T bili: 1.2  Lact (a): - Lact (v): -   eGFR: >60 Ca: 7.5   34   Prot: 5.2 Alb: 2.7  Trop I: - D-dimer: -   Mg: 1.9 PO4: 2.0  ANC: -     BNP: - Anti-Xa: -     ALC: -    INR: -  ASSESSMENT/PLAN      Cody Clark is a 64 year old male with hx CAD, cirrhosis, HTN, recurrent laryngeal cancer who presented with airway compromise at OSH s/p intubation transferred here to [place] now s/p tracheostomy on 7/12.    #Laryngeal cancer s/p tracheostomy  - management per primary team  - NGT in place. Is planned for PEG on 7/18    #HTN  #HLD  #CAD  NSTEMI in 2019 and STEMI in 2021 with stent placement in June 2021  - cont atorvastatin, carvedilol 6.25mg BID. 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 Currently sober. Currently compensated. Hx of ascites well controlled on lasix 40/spironolactone 100  - cont home lasix 40mg, spironolactone 100mg  - cont Lactulose 20 grams daily    #Hypophosphatemia  #Hypomagnesemia  #Nutrition  - cont to replete. Recommend additional 40meq IV sodium phos  - if still having diarrhea today, add banana flakes 1 packet BID (can be found in enteral nutrition order set)    #Thrombocytopenia  Suspect in the setting of his underlying cirrhosis. No evidence of active bleeding. CT A/P done on 05/14/20 with evidence of splenomegaly  - hold ASA and pharmacology DVT ppx given platelets <50k  - recommend 1u platelets early AM on 7/18 prior to PEG placement (~1am). Check CBC after platelet transfusion  - daily  CBC    #R acromioclavicular septic arthritis   #Group B Strep Septicemia   Hx of septic arthritis and group G bacteremia s/p removal of distal third clavicle and debridement on 05/15/20. 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 He received 2 months of IV abx previously and was seen in ID clinic on 10/09/20 who recommended 2 additional months of PO cefdinir 300mg BID  - cont cefdnir 300mg BID    #Tobacco use disorder  - continue nicotine patch    #GERD  -Omeprazole 20mg daily    Thank you for involving us in this patient's care. Please reach Primary Contact in CORES with additional questions/concerns.We will continue to follow peripherally

## 2020-11-09 NOTE — Care Plan (Addendum)
Oto-HNS APP Inpatient Note   Current Anticipated Discharge Date: anticipated discharge date in Tuesday 11/12/20.     Clinical/Medical Status Update: stable at this time.  - Tracheostomy transitioned to a cuffless #6 shiley trach on 11/08/20: [name] tolerated change in devise.   - Plan for placement of gastric feeding tube on Monday 11/11/20 per interventional radiology. Currently has a nasal feeding tube in place with good tolerance of tube feeding formula. Nutrition is following with recommendations for tube feeding in anticipation of discharge to home.   - Medicine consult in place providing supportive management of previous health concerns: cirrhosis, CAD, hypertension, history of septic arthritis, GERD, hypophosphatemia and thrombocytopenia. Patient is stable at this time.       Anticipated Post-Acute/Post Discharge Care Needs:  - Tube feeding: as noted patient will have a gastric feeding tube per IR on 11/11/20. Home infusion has been contacted and orders are in place for supplies. We will follow into next week for dc teaching prior to departure from the hospital.   - Plan to dc home at this time. [name] has plans to initiate treatment for newly diagnosed laryngeal cancer at [facility] with radiation and chemotherapy. I was able to contact outside providers to update on [name] condition. Our goal is to discharge Mr. Braddy to home early next week. It is essential that he starts treatment in a timely manner.   Patient's tracheostomy tube will remain in place due to concern for a stable airway. As noted he has transitioned to a cuffless tube at this time and is doing well. Tracheostomy teaching with respiratory therapy was initiated on Friday 11/08/20. I spoke with Donnie Pozorski in regard to setting up for trach supplies. Orders are in place at this time.    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  Trach Size: #6 cuffless  Trach Type: Shiley  Speaking Valve: Yes  HME: Yes  Supplies: Trach care kit  Supplies: Trach ties  Supplies: 4x4 gauze  Supplies: Gloves  Supplies: Sterile water  Supplies: Hydrogen peroxide  Supplies: Drainage sponge  Duration (months): 99      Review of patient's allergies indicates:        Allergies   Allergen Reactions   . Apap [Acetaminophen] Unknown     Reported from OSH   . Pcn [Penicillins] Unknown     Reported from OSH     . Vancomycin Unknown     Reported from OSH     Visit Coverage:  [place] Name    Medicare Medicare Part A And B [phone]  Delynn Flavin        Electronically Signed by: Bertram Denver, [place] Provider:  Bertram Denver, [place] Provider NPI: [id number]          Med Staff ID:  [id number]                 Ordering User ID:  [id number]  CSN: [id number]    Order Status Updates    User Action Date   Bertram Denver [[id number]] Signed [3] Sat Nov 09, 2020 3:56 PM         Discharge Preparation:  Ongoing. I will follow Mr. Fenoglio throughout the weekend.     Social Work Alert: Our social worker Irish Lack is aware of the plan for discharge home next week. We will assess need for home health nursing at that time.     Addendum: plan for discharge to home on 11/12/20. Additional orders for trach mist and portable suction with supplies. The  following are the orders:     Patient:       Cody, Clark MRN: [id number]  [id number] Height:     Ht Readings from Last 1 Encounters:   11/06/20 5' 7.4" (1.712 m)      [address]  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 Dais has a newly diagnosed laryngeal cancer. Tracheostomy placed on 11/05/20. Plan to discharge to home on 11/12/20. Patient will need portable suction due to tracheostomy tube to maintain patency at airway.  Equipment: Home portable suctioning device adult  Supplies: Vacuum unit  Supplies: Suction catheter  Supplies: [place]  Supplies: Normal saline  Supplies: Suction canister  Supplies: Suction tubing  Catheter Size: #41F  Duration (months): 99  Reason for suction: Difficulty with raising and clearing secretions and requires portable suction to manage secretions      Review of patient's allergies indicates:        Allergies   Allergen Reactions   . Apap [Acetaminophen] Unknown     Reported from OSH   . Pcn [Penicillins] Unknown     Reported from OSH     . Vancomycin Unknown     Reported from OSH     Visit Coverage:  [place] Name    Medicare Medicare Part A And B [id number]   Delynn Flavin        Electronically Signed by: Bertram Denver, [place] Provider:  Bertram Denver, ARNP  Authorizing Provider NPI: [id number]          Med Staff ID:  [phone]                 Ordering User ID:  [id number]  CSN: [id number]    Order Status Updates    User Action Date   Bertram Denver [[id number]] Signed [3] Sat Nov 09, 2020 4:09 PM     Patient:       Cody, Clark MRN: [id number]  [id number] Height:     Ht Readings from Last 1 Encounters:   11/06/20 5' 7.4" (1.712 m)      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 Apap [Acetaminophen] Unknown     Reported from OSH   . Pcn [Penicillins] Unknown     Reported from OSH     . Vancomycin Unknown     Reported from OSH     Visit Coverage:  [place] Name    Medicare Medicare Part A And B [id number]   Delynn Flavin        Electronically Signed by: Bertram Denver, [place] Provider:  Bertram Denver, [place] Provider NPI: [id number]          Med Staff ID:  [id number]                 Ordering User ID:  [id number]  CSN: [id number]    Order Status Updates    User Action Date   Bertram Denver [[id number]] Signed [3] Mon Nov 11, 2020 5:08 PM

## 2020-11-09 NOTE — Progress Notes (Addendum)
Inpatient Follow Up Nutrition Assessment    Assessment   65 y/o male POD1 s/p tracheostomy for R laryngeal sinus CA.     Verbal consult received for bolus tube feeding recs.   See Consult Note from 7/15 for full assessment information.     Labs: BG 147, Phos 2.0.   Medications: Lasix, Lactulose, Omeprazole.     Nutrition Requirements:  *Using admit wt, 60.6 kg  ENERGY: 1820-2120 kcal (30-35 kcal/kg)   PROTEIN: 79-91 g (1.3-1.5 g/kg)   FLUID: per MD       Active Diet Orders   Nourishments    Adult tube feeding (continuous)    Banana flakes (Enteral Supplement) Amount: 1 packet of banana flakes   Diet    NPO diet NPO except for medications     Active Nourishment Orders   Nourishments    Adult tube feeding (continuous)    Banana flakes (Enteral Supplement) Amount: 1 packet of banana flakes     Pt currently w Jevity 1.5 running at goal rate 60 mL/hr, tolerating. No current indications of refeeding. 30 mL free water Q4 to maintain tube patency.    = 1320 mL, 1980 kcal, 84 g protein, 285 g CHO, 29 g Fiber, 1003 mL free water.     Recommendations:  1. Bolus tube feeding recs:   - 330 mL Jevity 1.5 QID. Infuse over 2 hours to start (rate 165 mL/hr) and increase as pt tolerates.    - Flush tube w 100 mL free water before/after each bolus for hydration.      2. Continue 100 mg Thiamine daily x7 days (end 7/21) for refeeding risk.     3. Diet per SLP/MD as appropriate.     Marletta Lor, RD  Please see team coverage for dietitian contact information  Weekend pager [phone]

## 2020-11-09 NOTE — Nursing Note (Signed)
Patient Summary  65 y/o M with recurrent tonsillar CA and recently started radiation tx who presented with obstructive laryngeal mass at OSH s/p intubation transferred here to [facility] now s/p tracheostomy on 7/12    PMH: hx CAD, MI s/p stents 09/2019, HFpEF 57%, EtOH cirrhosis, HTN, Group B strep septicemia r/t R clavicle/spine osteomyletis, L2-3 discitis with epidural phlegmon and abscess (currently on long-term abx)    PSH: R clavicle debridement 04/2020, L oropharyngeal resx (Futran, 2011) +chemoradiation  Edited by: Sheran Fava, RN at 11/07/2020 0134    Illness Severity  Stable  Edited byRenato Battles, Charlies Constable at 11/05/2020 2214    Overnight pt expressed interest in learning how to care for trach and PEG.  RN provided education and pt observed removal of inner cannula with mirror and cleaning with diluted hydrogen peroxide..  Pt independently clearing secretions from trach with coughing and tissue.  Printed out PEG insertion education.  Pt very motivated to return home upon discharge.

## 2020-11-09 NOTE — Progress Notes (Signed)
11/09/20 1715   Oxygen Therapy   SpO2 (S)  99 %   Oxygen Therapy (S)  None (Room air)   O2 Delivery Method (S)  Aerosol mask;Trach mask;Trach tube   FiO2 (%) (S)  21 %   O2 Flow Rate (L/min) (S)  10 L/min   Patient Activity (S)  At rest   Safety Instructions (S)  Yes (Comment)  (ambu, spare trach)   ROX Index 26.19   $ Pulse Ox One-time Charge (S)  Yes   Respiratory Assessment   Assessment Type (S)  Assess only   Resp (S)  18   Respiratory Pattern (S)  Normal   Chest Assessment (S)  Symmetrical   Cough (S)  Productive;Strong;Able to clear secretions   Suction (S)  No   Bilateral Breath Sounds (S)  Diminished   Surgical Airway 11/05/20 Shiley 6   Placement Date/Time: 11/05/20 1718   Surgical Airway Type: Tracheostomy  Brand: Shiley  Style: Cuffed  Size (mm): 6   Cuff Status (S)  Other (Comment)  (CFS)   Site Assessment (S)  Drainage;Red   Site Care (S)  Cleansed;Dried;Dressing applied   Inner Cannula Care (S)  Cleansed/dried   Ties Assessment (S)  Clean;Dry;Intact     Pt admitted to 4 NE this afternoon. All trach care supplies are at the bedside. Trach #6.0 Shiley cuffed and #4.0 CFS are at the bedside. Aerosol set up for humidifications. RT will continue to monitor.

## 2020-11-09 NOTE — Progress Notes (Signed)
11/09/20 0423   Vital Signs   Heart Rate Source Monitor   Oxygen Therapy   Oxygen Therapy None (Room air)   O2 Delivery Method Trach mask   FiO2 (%) 21 %   O2 Flow Rate (L/min) 10 L/min   Patient Activity At rest   Respiratory Assessment   Assessment Type Assess only   Resp 16   Respiratory Pattern Normal   Chest Assessment Symmetrical;Chest expansion symmetrical   Cough Strong;Spontaneous;Productive   Assisted cough No   Suction No   Airways   Airway LDA Tracheostomy   Surgical Airway 11/05/20 Shiley 6   Placement Date/Time: 11/05/20 1718   Surgical Airway Type: Tracheostomy  Brand: Shiley  Style: Cuffed  Size (mm): 6   Cuff Leak Yes   Cuff Status Cuff Deflated   Site Assessment Sutured;Dry   Site Care Cleansed;Dried   Inner Cannula Care Cleansed/dried   Ties Assessment Intact;Secure     Pt remains stable on current ATC 21%/10L t/o the night.  Trach care done x2.   Pt have a strong productive cough.  Emergency Trach kit at bedside.  RT will continue to monitor pt status.

## 2020-11-09 NOTE — Nursing Note (Signed)
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 Aox4, VSS on trach collar FiO2 21% @ 10L. Trach Shiley #6 cuffless. NPO. Mod neck pain managed w/ PO dilaudid. Meds crushed thru NG tube, TF running. Up ind, voiding naturally in BR. No BM today, reported 6 BM yesterday.   Edited by: Cammie Sickle, RN at 11/09/2020 1911    Illness Severity  Stable  Edited by: Cammie Sickle, RN at 11/09/2020 1911

## 2020-11-10 ENCOUNTER — Other Ambulatory Visit (HOSPITAL_COMMUNITY): Payer: Self-pay | Admitting: Physician Assistant

## 2020-11-10 DIAGNOSIS — A401 Sepsis due to streptococcus, group B: Secondary | ICD-10-CM

## 2020-11-10 LAB — COVID-19 CORONAVIRUS QUALITATIVE PCR: COVID-19 Coronavirus Qual PCR Result: NOT DETECTED

## 2020-11-10 LAB — CBC (HEMOGRAM)
Hematocrit: 33 % — ABNORMAL LOW (ref 38.0–50.0)
Hematocrit: 30 % — ABNORMAL LOW (ref 38.0–50.0)
Hemoglobin: 10.5 g/dL — ABNORMAL LOW (ref 13.0–18.0)
Hemoglobin: 9.7 g/dL — ABNORMAL LOW (ref 13.0–18.0)
MCH: 25.5 pg — ABNORMAL LOW (ref 27.3–33.6)
MCH: 25.8 pg — ABNORMAL LOW (ref 27.3–33.6)
MCHC: 31.4 g/dL — ABNORMAL LOW (ref 32.2–36.5)
MCHC: 31.9 g/dL — ABNORMAL LOW (ref 32.2–36.5)
MCV: 81 fL (ref 81–98)
MCV: 81 fL (ref 81–98)
Platelet Count: 54 10*3/uL — ABNORMAL LOW (ref 150–400)
Platelet Count: 44 10*3/uL — ABNORMAL LOW (ref 150–400)
RBC: 4.12 10*6/uL — ABNORMAL LOW (ref 4.40–5.60)
RBC: 3.76 10*6/uL — ABNORMAL LOW (ref 4.40–5.60)
RDW-CV: 22 % — ABNORMAL HIGH (ref 11.6–14.4)
RDW-CV: 21.4 % — ABNORMAL HIGH (ref 11.6–14.4)
WBC: 8.22 10*3/uL (ref 4.3–10.0)
WBC: 6.33 10*3/uL (ref 4.3–10.0)

## 2020-11-10 LAB — FOLATE & VITAMIN B12
Folate, SRM: 7.6 ng/mL (ref >5.8)
Vitamin B12 (Cobalamin): 822 pg/mL (ref 180–914)

## 2020-11-10 LAB — COMPREHENSIVE METABOLIC PANEL
ALT (GPT): 15 U/L (ref 10–48)
AST (GOT): 21 U/L (ref 9–38)
Albumin: 3 g/dL — ABNORMAL LOW (ref 3.5–5.2)
Alkaline Phosphatase (Total): 111 U/L (ref 37–159)
Anion Gap: 7 (ref 4–12)
Bilirubin (Total): 1.7 mg/dL — ABNORMAL HIGH (ref 0.2–1.3)
Calcium: 8.1 mg/dL — ABNORMAL LOW (ref 8.9–10.2)
Carbon Dioxide, Total: 31 meq/L (ref 22–32)
Chloride: 101 meq/L (ref 98–108)
Creatinine: 0.78 mg/dL (ref 0.51–1.18)
Glucose: 77 mg/dL (ref 62–125)
Potassium: 5.3 meq/L — ABNORMAL HIGH (ref 3.6–5.2)
Protein (Total): 5.8 g/dL — ABNORMAL LOW (ref 6.0–8.2)
Sodium: 139 meq/L (ref 135–145)
Urea Nitrogen: 18 mg/dL (ref 8–21)
eGFR by CKD-EPI 2021: >60 mL/min/{1.73_m2} (ref >59)

## 2020-11-10 LAB — MAGNESIUM
Magnesium: 1.8 mg/dL (ref 1.8–2.4)
Magnesium: 1.8 mg/dL (ref 1.8–2.4)

## 2020-11-10 LAB — BLOOD TYPE: ABO/Rh: O POS

## 2020-11-10 LAB — PHOSPHATE
Phosphate: 2.8 mg/dL (ref 2.5–4.5)
Phosphate: 4.4 mg/dL (ref 2.5–4.5)

## 2020-11-10 LAB — POTASSIUM, WBLD: Potassium: 4.4 meq/L (ref 3.7–5.2)

## 2020-11-10 MED ORDER — SODIUM PHOSPHATES 45 MMOLE/15ML IV SOLN
40.0000 meq | Freq: Once | INTRAVENOUS | Status: AC
Start: 2020-11-10 — End: 2020-11-10
  Administered 2020-11-10: 40 meq via INTRAVENOUS
  Filled 2020-11-10: qty 10

## 2020-11-10 MED ORDER — IOHEXOL 300 MG/ML IJ SOLN
50.0000 mL | Freq: Once | INTRAMUSCULAR | Status: AC
Start: 2020-11-10 — End: 2020-11-10
  Administered 2020-11-10: 50 mL via ORAL

## 2020-11-10 MED ORDER — TRAMADOL HCL 50 MG OR TABS
50.0000 mg | ORAL_TABLET | Freq: Two times a day (BID) | ORAL | Status: DC
Start: 2020-11-10 — End: 2020-11-12
  Administered 2020-11-10 (×2): 50 mg via NASOGASTRIC
  Filled 2020-11-10 (×2): qty 1

## 2020-11-10 NOTE — Progress Notes (Signed)
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 He has recently (January 2022) developed a new right laryngeal/piriform sinus cancer ([id number]) that was found to be poorly differentiated on biopsy by his local Otolaryngologist.    He presented to OSH with airway compromise, intubated and transferred here to [facility], now s/p trach 63/12/22    SUBJECTIVE:  - AFVSS   - NAEON  - Plts up to 54 (33-> 34 <- 38)  - Pain well controlled  - Transitioned to bolus feeds (7/16)    OBJECTIVE:  BP (!) 109/54 (Patient Position: Lying)   Pulse 64   Temp 36 C (Temporal)   Resp 18   Ht 5' 7.4" (1.712 m)   Wt 60.6 kg (133 lb 9.6 oz)   SpO2 96%   BMI 20.68 kg/m     GEN: resting comfortably in bed, no acute distress, writing notes to communicate  HEENT:  Eye:  Very subtle R eyelid droop, PERRLA, CN V1-3 intact, CNVII intact.    Neck: 6-0 shiley cuffless in place. Minimal amount of mucus around flanges. Skin around trach stoma is clean, dry and with no breakdown. Tolerating new trach well.  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 POD1 tracheostomy, stable.  Admitted OSH with respiratory compromise, intubated and transferred to [facility], now S/p Trach 11/05/20    POD#5:  #laryngeal cancer s/p tracheostomy  - Continue HTC  - RT to assist with trach teaching  - Trach changed to 6.0 Shiley Cuffless (07/16)    #malnutrition  - Monitor for re-feeding  syndrome and replete as necessary  - K > 4, Mg > 2 and Phos > 3    #dysphagia  - Tolerating continuous tube feeds via NG tube   - Advanced to bolus feeds (7/16)  - Monitor electrolytes for refeeding syndrome   - Replete Phos today (07/17)  - IR to place G-tube (7/18)   - Plts needs to be > 50K   - Med Con recs replacing Plts on 07/18 at 1 am. Will have labs checked this PM and   1U transfused if platelets < 50K. Will have labs re-checked and then give another 1U  again afterwards if Plts still < 50K. No action needed if either PM or early AM labs  tomorrow are > 50K.  - Discontinue Q6hr finger stick glucose checks    #Cirrhosis- appreciate med con recs  - Monitor platelet count.  Will need to be >50 prior to G-tube procedure in IR on 7/18  - continue lactulose  - Restart Lasix (07/15)  - Hold spironolactone given hyperkalemia (K = 5.3) (07/17)   - Check whole blood potassium tmr (07/18)    #DVT Prophylaxis  - Continue to hold enoxaparin (plts 54 today)    #PT/OT  - Consult to work with patient    #  Dispo  - Home vs. Resp SNF (discharge planning still in process)  - Anticipate d/c with trach and gtube supplies next week    Tania Ade, MD  Resident  PGY-1  Otolaryngology - Head and Neck Surgery

## 2020-11-10 NOTE — Progress Notes (Addendum)
Progress Note     Cody Clark ("Cody Clark") - DOB: 03/28/1956 (64 year old male)  Preferred Pronouns: patient's name  Admit Date: 11/05/2020  Code Status: Full Code       CHIEF CONCERN / IDENTIFICATION:  Cody Clark is a 64 year old male with hx CAD, cirrhosis, HTN, recurrent laryngeal cancer who presented with airway compromise at OSH s/p intubation transferred here to [place] now s/p tracheostomy on 7/12.    Medicine consulted for assistance with management of comorbidities.      SUBJECTIVE   INTERVAL HISTORY:  - did not have any BM yesterday. He asked RN for 1/2 dose of lactulose  - denies any dyspnea, chest pain. Switched to bolus tube feeds, which he is tolerating  - eager to get out of the hospital  - abdominal distention feeling better    SCHEDULED MEDICATIONS:   .  atorvastatin, 40 mg, Daily  .  carVEDilol, 6.25 mg, BID  .  cefdinir, 300 mg, BID  .  [Held By Provider] enoxaparin, 40 mg, q HS  .  furosemide, 40 mg, Daily  .  iohexol, 50 mL, Once  .  lactulose, 20 g, Daily  .  nicotine, 1 patch, Daily  .  omeprazole, 20 mg, Daily empty stomach  .  [Held By Provider] spironolactone, 100 mg, Daily  .  thiamine mononitrate, 100 mg, Daily    INFUSED MEDICATIONS:       PRN MEDICATIONS:  dextrose, 50-75 mL/hr, PRN  .  dextrose, 125 mL, PRN  .  diphenhydrAMINE, 25 mg, q4h PRN  .  glucagon, 0.5 mg, PRN  .  glucagon, 1 mg, PRN  .  HYDROmorphone, 1-2 mg, q3h PRN  .  labetalol, 10 mg, Q2 Hours PRN  .  lactulose, 20 g, Daily PRN       OBJECTIVE     Vitals (Most recent in last 24 hrs)     T: 36 C (11/10/20 0803)  BP: (!) 109/54 (11/10/20 0807)  HR: 64 (11/10/20 0821)  RR: 18 (11/10/20 0807)  SpO2: 99 % (11/10/20 0821) Room air  T range: Temp  Min: 36 C  Max: 37.2 C  Admit weight: 60.6 kg (133 lb 9.6 oz) (11/05/20 0556)  Last weight: 60.6 kg (133 lb 9.6 oz) (11/05/20 0556)       I&Os:     Intake/Output Summary (Last 24 hours) at 11/10/2020 0902  Last data filed at 11/10/2020 0800  Intake 1680 ml   Output 575 ml   Net  1105 ml       Physical Exam  GENERAL: laying in bed in NAD  EYES: Anicteric, EOMI  ENT: trach in place. NGT in place  CV: RRR. Normal S1, S2. No mrg  RESP: Unlabored breathing. CTA b/l  GI:  Abd Soft, NTND  MSK: WWP. No LE edema  Skin: warm, dry, no rashes  NEURO: Alert, oriented. Face symmetric. Moves all limbs. No asterixes  PSYCH: Pleasant, appropriate affect     Labs (last 24 hours):   Chemistries  CBC  LFT  Gases, other   139 101 18 77   10.5   AST: 21 ALT: 15  -/-/-/-  -/-/-/-   5.3 31 0.78   8.22 >< 54  AP: 111 T bili: 1.7  Lact (a): - Lact (v): -   eGFR: >60 Ca: 8.1   33   Prot: 5.8 Alb: 3.0  Trop I: - D-dimer: -   Mg: 1.8 PO4: 2.8  ANC: -       BNP: - Anti-Xa: -     ALC: -    INR: -            ASSESSMENT/PLAN      Cody Clark is a 64 year old male with hx CAD, cirrhosis, HTN, recurrent laryngeal cancer who presented with airway compromise at OSH s/p intubation transferred here to [place] now s/p tracheostomy on 7/12.    #Laryngeal cancer s/p tracheostomy  - management per primary team  - NGT in place. Is planned for PEG on 7/18    #HTN  #HLD  #CAD  NSTEMI in 2019 and STEMI in 2021 with stent placement in June 2021  - cont atorvastatin, carvedilol 6.25mg BID. 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 Currently sober. Currently compensated. Hx of ascites well controlled on lasix 40/spironolactone 100  - cont home lasix 40mg  - hold home spironolactone 100mg given mild hyperkalemia (was already given this AM)  - please order whole blood potassium at 2100. If K >5.5, given 40mg IV lasix  - cont Lactulose 20 grams daily    #Hypophosphatemia  #Hypomagnesemia  #Nutrition  - cont to replete. Recommend additional 40meq IV sodium phos  - please reduce banana flakes to 1 packet daily    #Thrombocytopenia  Suspect in the setting of his underlying cirrhosis. No evidence of active bleeding. CT A/P done on 05/14/20 with evidence of splenomegaly  - hold ASA and  pharmacology DVT ppx given platelets <50k  - please check CBC at 2100. If plt<50k, give 1u overnight in preparation for PEG placement tomorrow  - daily CBC    #R acromioclavicular septic arthritis   #Group B Strep Septicemia   Hx of septic arthritis and group G bacteremia s/p removal of distal third clavicle and debridement on 05/15/20. 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 He received 2 months of IV abx previously and was seen in ID clinic on 10/09/20 who recommended 2 additional months of PO cefdinir 300mg BID  - cont cefdnir 300mg BID    #Tobacco use disorder  - continue nicotine patch    #GERD  -Omeprazole 20mg daily    Thank you for involving us in this patient's care. Please reach Primary Contact in [place] with additional questions/concerns.We will continue to follow peripherally

## 2020-11-10 NOTE — Progress Notes (Signed)
11/10/20 0615   Vital Signs   Pulse 72   Heart Rate Source Monitor   Position Semi-Fowler's   Oxygen Therapy   SpO2 94 %   Oxygen Therapy None (Room air)   O2 Delivery Method Trach mask  (humidified medical air)   FiO2 (%) 21 %   Patient Activity At rest   ROX Index 24.87   $ Pulse Ox Daily Charge Yes   Respiratory Assessment   Assessment Type Assess only   Resp 18   Respiratory Pattern Normal   Chest Assessment Chest expansion symmetrical   Cough Strong;Productive;Able to clear secretions   Suction Yes   Bilateral Breath Sounds Clear;Diminished   Sputum Description   Sputum Amount Small   Sputum Color Tan   Sputum Consistency Thick   Sputum How Obtained Tracheal  (and self suction yankauer)   Suctioning/Secretions   Suction Type Tracheal  (& oral)   Suction Device Catheter   Secretion Amount Small   Secretion Color Tan   Secretion Consistency Thick   Suction Tolerance Tolerated well   Suctioning Adverse Effects None   Surgical Airway 11/05/20 Shiley 6   Placement Date/Time: 11/05/20 1718   Surgical Airway Type: Tracheostomy  Brand: Shiley  Style: Cuffed  Size (mm): 6   Cuff Status Other (Comment)  (cuffless)   Site Assessment Dry;Red   Site Care Cleansed;Dried;Dressing applied   Inner Cannula Care Cleansed/dried   Ties Assessment Clean;Dry;Intact   Patient on aerosol trach collar overnight. RCP will continue to monitor.

## 2020-11-10 NOTE — Nursing Note (Signed)
Patient Summary  65 y/o M with Hx of CAD, MI s/p stents 09/2019, HFpEF 57%, EtOH cirrhosis, HTN, Group B strep septicemia r/t R clavicle/spine osteomyletis, L2-3 discitis with epidural phlegmon and abscess (currently on long-term abx) recurrent tonsillar CA and recently started radiation tx who presented with obstructive laryngeal mass at OSH s/p intubation transferred here to [facility] now s/p tracheostomy on 7/12    A&Ox4, VSS on trach collar FiO2 21% @ 10L. Trach Shiley #6 cuffless. Moderate clear secretions, able to clear w/ strong cough, using yankauer independently. Mod neck pain managed w/ PO dilaudid+tramadol. NPO. All meds crushed thru NGT. Bolus TF's QID through kangaroo pump, tolerating well. Up ind, voiding naturally in BR. +flatus +BM.  Edited by: Cammie Sickle, RN at 11/10/2020 1319    Illness Severity  Stable  Edited by: Cammie Sickle, RN at 11/09/2020 1911

## 2020-11-10 NOTE — Nursing Note (Signed)
Patient Summary  65 y/o M with Hx of CAD, MI s/p stents 09/2019, HFpEF 57%, EtOH cirrhosis, HTN, Group B strep septicemia r/t R clavicle/spine osteomyletis, L2-3 discitis with epidural phlegmon and abscess (currently on long-term abx) recurrent tonsillar CA and recently started radiation tx who presented with obstructive laryngeal mass at OSH s/p intubation transferred here to [facility] now s/p tracheostomy on 7/12    Pt arrived on floor at 1600. A&Ox4, VSS on trach collar FiO2 21% @ 10L. Trach Shiley #6 cuffless. Moderate clear secretions, able to clear w/ strong cough, using yankauer independently. Mod neck pain managed w/ PO dilaudid. NPO, ok for sips of water. All meds crushed thru NGT. Bolus TF's QID through kangaroo pump, tolerating well. Up ind, voiding naturally in BR. No BM overnight, reported BM x6 on 7/16.   Edited by: Mel Almond, RN at 11/10/2020 0615    Illness Severity  Stable  Edited by: Cammie Sickle, RN at 11/09/2020 1911

## 2020-11-11 ENCOUNTER — Inpatient Hospital Stay (HOSPITAL_COMMUNITY): Payer: Medicare Other | Admitting: Certified Registered Nurse Anesthetist

## 2020-11-11 ENCOUNTER — Ambulatory Visit: Payer: Medicare Other | Attending: Nurse Practitioner

## 2020-11-11 DIAGNOSIS — Z931 Gastrostomy status: Secondary | ICD-10-CM

## 2020-11-11 DIAGNOSIS — C329 Malignant neoplasm of larynx, unspecified: Secondary | ICD-10-CM | POA: Insufficient documentation

## 2020-11-11 DIAGNOSIS — I213 ST elevation (STEMI) myocardial infarction of unspecified site: Secondary | ICD-10-CM

## 2020-11-11 DIAGNOSIS — Z930 Tracheostomy status: Secondary | ICD-10-CM | POA: Insufficient documentation

## 2020-11-11 DIAGNOSIS — F17200 Nicotine dependence, unspecified, uncomplicated: Secondary | ICD-10-CM

## 2020-11-11 DIAGNOSIS — M19011 Primary osteoarthritis, right shoulder: Secondary | ICD-10-CM

## 2020-11-11 DIAGNOSIS — Z7982 Long term (current) use of aspirin: Secondary | ICD-10-CM

## 2020-11-11 DIAGNOSIS — Z955 Presence of coronary angioplasty implant and graft: Secondary | ICD-10-CM

## 2020-11-11 DIAGNOSIS — R1312 Dysphagia, oropharyngeal phase: Secondary | ICD-10-CM

## 2020-11-11 DIAGNOSIS — D099 Carcinoma in situ, unspecified: Secondary | ICD-10-CM

## 2020-11-11 LAB — PREPARE PLATELETS
Expiration Date of Blood Product: 202207182312
ISBT Product Blood type: 5100
Product Issue Date/Time: 202207172344
Unit Division: 0
Unit Type and Rh: O POS
Units Ordered: 1
Volume Required: 0 mL

## 2020-11-11 LAB — CBC (HEMOGRAM)
Hematocrit: 29 % — ABNORMAL LOW (ref 38.0–50.0)
Hemoglobin: 9.4 g/dL — ABNORMAL LOW (ref 13.0–18.0)
MCH: 25.5 pg — ABNORMAL LOW (ref 27.3–33.6)
MCHC: 32 g/dL — ABNORMAL LOW (ref 32.2–36.5)
MCV: 80 fL — ABNORMAL LOW (ref 81–98)
Platelet Count: 73 10*3/uL — ABNORMAL LOW (ref 150–400)
RBC: 3.68 10*6/uL — ABNORMAL LOW (ref 4.40–5.60)
RDW-CV: 21.7 % — ABNORMAL HIGH (ref 11.6–14.4)
WBC: 6.13 10*3/uL (ref 4.3–10.0)

## 2020-11-11 LAB — PHOSPHATE
Phosphate: 2.7 mg/dL (ref 2.5–4.5)
Phosphate: 4.2 mg/dL (ref 2.5–4.5)

## 2020-11-11 LAB — FOLATE & VITAMIN B12
Folate, SRM: 7.1 ng/mL (ref >5.8)
Vitamin B12 (Cobalamin): 872 pg/mL (ref 180–914)

## 2020-11-11 LAB — COMPREHENSIVE METABOLIC PANEL
ALT (GPT): 12 U/L (ref 10–48)
AST (GOT): 19 U/L (ref 9–38)
Albumin: 2.9 g/dL — ABNORMAL LOW (ref 3.5–5.2)
Alkaline Phosphatase (Total): 97 U/L (ref 37–159)
Anion Gap: 7 (ref 4–12)
Bilirubin (Total): 1.5 mg/dL — ABNORMAL HIGH (ref 0.2–1.3)
Calcium: 7.9 mg/dL — ABNORMAL LOW (ref 8.9–10.2)
Carbon Dioxide, Total: 30 meq/L (ref 22–32)
Chloride: 98 meq/L (ref 98–108)
Creatinine: 0.79 mg/dL (ref 0.51–1.18)
Glucose: 90 mg/dL (ref 62–125)
Potassium: 4 meq/L (ref 3.6–5.2)
Protein (Total): 5.7 g/dL — ABNORMAL LOW (ref 6.0–8.2)
Sodium: 135 meq/L (ref 135–145)
Urea Nitrogen: 20 mg/dL (ref 8–21)
eGFR by CKD-EPI 2021: >60 mL/min/{1.73_m2} (ref >59)

## 2020-11-11 LAB — TYPE AND SCREEN
ABO/Rh: O POS
Antibody Screen: NEGATIVE

## 2020-11-11 LAB — MAGNESIUM
Magnesium: 1.8 mg/dL (ref 1.8–2.4)
Magnesium: 2.2 mg/dL (ref 1.8–2.4)

## 2020-11-11 LAB — GLUCOSE POC, ~~LOC~~: Glucose (POC): 224 mg/dL — ABNORMAL HIGH (ref 62–125)

## 2020-11-11 MED ORDER — SODIUM PHOSPHATES 45 MMOLE/15ML IV SOLN
40.0000 meq | Freq: Once | INTRAVENOUS | Status: AC
Start: 2020-11-11 — End: 2020-11-11
  Administered 2020-11-11: 40 meq via INTRAVENOUS
  Filled 2020-11-11: qty 10

## 2020-11-11 MED ORDER — LIDOCAINE HCL 2 % IJ SOLN
INTRAMUSCULAR | Status: DC | PRN
Start: 2020-11-11 — End: 2020-11-11
  Administered 2020-11-11: 60 mg via INTRAVENOUS

## 2020-11-11 MED ORDER — SODIUM CHLORIDE 0.9 % IR SOLN
20.0000 mL | Freq: Every day | Status: DC
Start: 2020-11-11 — End: 2020-11-12
  Administered 2020-11-12: 20 mL via GASTROSTOMY

## 2020-11-11 MED ORDER — SODIUM CHLORIDE 0.9 % IV SOLN
3.0000 g | Freq: Once | INTRAVENOUS | Status: AC
Start: 2020-11-11 — End: 2020-11-11
  Administered 2020-11-11: 3 g via INTRAVENOUS
  Filled 2020-11-11: qty 6

## 2020-11-11 MED ORDER — FENTANYL CITRATE (PF) 50 MCG/ML IJ SOLN WRAPPER (ANESTHESIA OSM ONLY)
INTRAMUSCULAR | Status: DC | PRN
Start: 2020-11-11 — End: 2020-11-11
  Administered 2020-11-11: 50 ug via INTRAVENOUS

## 2020-11-11 MED ORDER — ONDANSETRON HCL 4 MG/2ML IJ SOLN
4.0000 mg | INTRAMUSCULAR | Status: DC | PRN
Start: 2020-11-11 — End: 2020-11-12

## 2020-11-11 MED ORDER — IODIXANOL 320 MG/ML IV SOLN
50.0000 mL | Freq: Once | INTRAVENOUS | Status: AC
Start: 2020-11-11 — End: 2020-11-11
  Administered 2020-11-11: 12 mL via PERCUTANEOUS

## 2020-11-11 MED ORDER — CLINDAMYCIN PHOSPHATE IN D5W 900 MG/50ML IV SOLN
900.0000 mg | Freq: Once | INTRAVENOUS | Status: AC
Start: 2020-11-11 — End: 2020-11-11
  Administered 2020-11-11: 900 mg via INTRAVENOUS
  Filled 2020-11-11 (×2): qty 50

## 2020-11-11 MED ORDER — TRAMADOL HCL 50 MG OR TABS
50.0000 mg | ORAL_TABLET | Freq: Two times a day (BID) | ORAL | 0 refills | Status: DC
Start: 2020-11-11 — End: 2020-11-11

## 2020-11-11 MED ORDER — LACTATED RINGERS IV SOLN
INTRAVENOUS | Status: DC | PRN
Start: 2020-11-11 — End: 2020-11-11

## 2020-11-11 MED ORDER — HYDROMORPHONE HCL 2 MG OR TABS
2.0000 mg | ORAL_TABLET | ORAL | 0 refills | Status: AC | PRN
Start: 2020-11-11 — End: ?

## 2020-11-11 MED ORDER — FENTANYL CITRATE (PF) 100 MCG/2ML IJ SOLN
12.5000 ug | INTRAMUSCULAR | Status: DC | PRN
Start: 2020-11-11 — End: 2020-11-12

## 2020-11-11 MED ORDER — PROPOFOL 10 MG/ML IV EMUL WRAPPER (OSM ONLY)
INTRAVENOUS | Status: DC | PRN
Start: 2020-11-11 — End: 2020-11-11
  Administered 2020-11-11: 100 ug/kg/min via INTRAVENOUS

## 2020-11-11 MED ORDER — HYDROMORPHONE HCL 2 MG/ML IJ SOLN
0.2000 mg | INTRAMUSCULAR | Status: DC | PRN
Start: 2020-11-11 — End: 2020-11-12

## 2020-11-11 MED ORDER — NALOXONE HCL 0.4 MG/ML IJ SOLN
0.0400 mg | INTRAMUSCULAR | Status: DC | PRN
Start: 2020-11-11 — End: 2020-11-12

## 2020-11-11 MED ORDER — LABETALOL HCL 5 MG/ML IV SOLN
5.0000 mg | INTRAVENOUS | Status: DC | PRN
Start: 2020-11-11 — End: 2020-11-12

## 2020-11-11 MED ORDER — MORPHINE SULFATE (PF) 2 MG/ML IV/IJ SOLN WRAPPER
1.0000 mg | Status: DC | PRN
Start: 2020-11-11 — End: 2020-11-12

## 2020-11-11 MED ORDER — ONDANSETRON HCL 4 MG/2ML IJ SOLN
4.0000 mg | Freq: Three times a day (TID) | INTRAMUSCULAR | Status: DC | PRN
Start: 2020-11-11 — End: 2020-11-12

## 2020-11-11 MED ORDER — TRAMADOL HCL 50 MG OR TABS
50.0000 mg | ORAL_TABLET | Freq: Two times a day (BID) | ORAL | 0 refills | Status: AC
Start: 2020-11-11 — End: ?

## 2020-11-11 MED ORDER — THIAMINE MONONITRATE 100 MG OR TABS
100.0000 mg | ORAL_TABLET | Freq: Every day | ORAL | 0 refills | Status: AC
Start: 2020-11-12 — End: ?

## 2020-11-11 MED ORDER — HALOPERIDOL LACTATE 5 MG/ML IJ SOLN
1.0000 mg | Freq: Once | INTRAMUSCULAR | Status: DC | PRN
Start: 2020-11-11 — End: 2020-11-12

## 2020-11-11 MED ORDER — HYDROMORPHONE HCL 2 MG OR TABS
2.0000 mg | ORAL_TABLET | ORAL | 0 refills | Status: DC | PRN
Start: 2020-11-11 — End: 2020-11-11

## 2020-11-11 MED ORDER — LACTATED RINGERS BOLUS
500.0000 mL | Freq: Once | INTRAVENOUS | Status: DC | PRN
Start: 2020-11-11 — End: 2020-11-12

## 2020-11-11 NOTE — Progress Notes (Signed)
Assessment:  Education officer, museum received referral from primary team to assist with home health referral.  Discussed patient with Otolaryngology ARNP Tito Dine).  Plan is to discharge patient tomorrow.  He no longer needs respiratory SNF placement.    Discussed patient with Dietician Thersa Salt and Curt Bears) with [facility].  Patient is scheduled for teaching tomorrow at 9 AM and feeding supplies will be delivered to patient's home in [place].      Intervention:   Education officer, museum met with patient at bedside.  Medical team currently recommending home health RN.  Social worker discussed with patient the available discharge options.  Patient educated on medical team's recommendations and the benefits of home health.  Patient is receptive to home health referrals.  He longer wants SNF placement.  Social worker educated the patient about [employer], rating information and quality metrics.  Social worker provided the patient with printed material from [employer], reflecting ratings and quality measures for the agencies of his preference, based on his goals of care.  Advised that his will be referred to an agency of his choice.  Acceptance may be based on insurance network/preauth determination and availability of service needed.   Patient would like social worker to send referral to any [facility] agency.    Social worker sent referrals to [facility], [facility], [facility], [facility], and [facility].    Home health RN referral was sent to [facility] and discussed patient with Burns Donato Schultz).  Per Hinda Glatter unable to accept new patients.      Plan or outcome:   Social worker will follow-up with home health agencies on acceptance and start of care.    Education provided on transportation options.  Patient requested for car service ride with [employer].  Car Service with [employer] has been arranged for pick-up tomorrow at 13:00 P.M.     Called to arrange this trip with Engineer, drilling, Delrae Alfred, with the [employer].  Driver requested to go to [facility] to pick-up patient.  Contact Numbers for the [employer] Desk: [phone]    Discharge plan was discussed with [facility] Nurse Leonides Sake).      Patient's goal for discharge:   Patient's goal is to return home and receive home health services.

## 2020-11-11 NOTE — Anesthesia Postprocedure Evaluation (Signed)
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    Place of evaluation: PACU    Patient participation: patient participated    Level of consciousness: fully conscious    Patient pain control satisfaction: patient is satisfied with level of pain control    Airway patency: patent    Cardiovascular status during assessment: stable    Respiratory status during assessment: breathing comfortably    Anesthetic complications: no    Intravascular volume status assessment: euvolemic    Nausea / vomiting: patient is not experiencing nausea      Planned post-operative disposition at time of assessment: ward care

## 2020-11-11 NOTE — Nursing Note (Signed)
Patient Summary  65 y/o M with Hx of CAD, MI s/p stents 09/2019, HFpEF 57%, EtOH cirrhosis, HTN, Group B strep septicemia r/t R clavicle/spine osteomyletis, L2-3 discitis with epidural phlegmon and abscess (currently on long-term abx) recurrent tonsillar CA and recently started radiation tx who presented with obstructive laryngeal mass at OSH s/p intubation transferred here to [facility] now s/p tracheostomy on 7/12    A&Ox4, VSS on trach collar FiO2 21% @ 10L. Trach Shiley #6 cuffless. Moderate amt of secretions, able to clear w/ strong cough, using yankauer independently. Mod neck pain managed w/ PO dilaudid. Pt reported tramadol makes him lightheaded and does not want to take it anymore. All meds crushed thru NGT. Bolus TF's QID through kangaroo pump, tolerating well. NPO no exceptions since midnight for PEG tube placement. Up ind, voiding naturally in BR. Multiple loose BM's throughout the night, +flatus. 1u of plts transfused, no adverse reactions observed.  Edited by: Mel Almond, RN at 11/11/2020 [phone]    Illness Severity  Stable  Edited by: Cammie Sickle, RN at 11/09/2020 1911

## 2020-11-11 NOTE — H&P (Signed)
NON-OR PREPROCEDURE H&P      Planned procedure: No admission procedures for hospital encounter.  Diagnosis and indications for procedure: Head and neck cancer s/p trach, requiring long-term enteric nutrition    NOTE: Required to choose either "Complete H&P" or "Non-OR Preprocedure H&P"  Non-OR Pre-Procedure H&P: I have recorded the Relevant History and Changes to History below AND I have reviewed any available pre-procedure screening/assessments on the EMR or written forms.    RELEVANT HISTORY OR CHANGE TO HISTORY:    No past medical history on file.  No past surgical history on file.     Review of Systems (ROS) NA    Medications:  I have reviewed any medications recorded on any pre-procedure assessment or the EMR.  Current Outpatient Medications   Medication Instructions   . aspirin 81 mg, Oral, Daily   . atorvastatin (LIPITOR) 40 mg, Oral, Daily   . carVEDilol (COREG) 6.25 mg, Oral, 2 times daily   . cefdinir (OMNICEF) 300 mg, Oral, Every 12 hours scheduled   . furosemide 40 MG tablet Oral   . lactulose 20 g, Oral, Daily PRN   . melatonin 3 mg, Oral, Nightly PRN   . nicotine 14 MG/24HR patch 1 patch, Transdermal, Daily   . omeprazole (PRILOSEC) 20 mg, Oral, Daily empty stomach   . oxyCODONE 5 mg, Oral, Every 8 hours PRN   . spironolactone (ALDACTONE) 100 mg, Oral, Daily       Allergies:  I have reviewed any allergies recorded on any pre-procedure assessment or the EMR.  Review of patient's allergies indicates:  Allergies   Allergen Reactions   . Apap [Acetaminophen] Unknown     Reported from OSH   . Pcn [Penicillins] Unknown     Reported from OSH     . Vancomycin Unknown     Reported from OSH         Reviewed Labs:  I have reviewed available labs.    Pre-procedure physical exam by MD/DDS/ARNP/PA-C (must be recorded prior to procedure):  I have reviewed vital signs recorded on the EMR just prior to the procedure or recorded them below:  BP 113/78 (Patient Position: Lying)   Pulse (!) 57   Temp 36.1 C (Temporal)    Resp 16   Ht 5' 7.4" (1.712 m)   Wt 60.6 kg (133 lb 9.6 oz)   SpO2 99%   BMI 20.68 kg/m     Physical Exam    General appearance: Laying in bed in NAD    Cardiovascular: RRR    Respiratory: Tracheostomy in place, C/D/I    Abdominal: Soft, nontender    Assessment and Care Plan:  Appropriate to proceed with planned G-tube placement

## 2020-11-11 NOTE — Home Health Face To Face (Signed)
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    Ancillary Services (only allowed if Nursing, PT, or ST are ordered):  [place]    My clinical findings that support the need for the above services are: Cody Clark is a 65 yr old gentleman with a history of oral cavity cancer that was successfully treated. New finding of a laryngeal cancer with a plan for radiation and chemotherapy. Patient was admitted to the [facility] for the purpose of tracheostomy tube to establish a stable airway and placement of a gastric feeding tube. Plan to return home to initiate treatment. Plan to setup for home health nursing as [name] transitions to home.     I certify that this patient is homebound (.i.e. absences from require considerable and taxing effort and are for medical reasons or religious services and are infrequent or of short duration) because: due to newly diagnosed laryngeal cancer with recent placement of tracheostomy tube.     The [facility] Services (CMS) requires the hospitalist/attending signing the Face to Face Encounter documentation to identify the community physician in the discharge plan for home health care (the provider assuming ongoing management of the [place]).    Addendum for home health orders: add on for physical therapy and speech pathology.     Primary Care Physician: Lindell Spar, MD    Attending Name:    Rona Ravens,  BICH-CHIEU  HELLER, Senaida Ores, NEAL D  11/11/2020 at 3:32 PM

## 2020-11-11 NOTE — Progress Notes (Signed)
Progress Note     Cody RUELAS Gwyndolyn Saxon") - DOB: [birth year] 65 year old male)  Preferred Pronouns: patient's name  Admit Date: 11/05/2020  Code Status: Full Code       CHIEF CONCERN / IDENTIFICATION:  Cody Clark is a 65 year old male with hx CAD, cirrhosis, HTN, recurrent laryngeal cancer who presented with airway compromise at OSH s/p intubation transferred here to [facility] now s/p tracheostomy on 7/12.    Medicine consulted for assistance with management of comorbidities.      SUBJECTIVE   INTERVAL HISTORY:  - had G tube placed this AM  - denies any dyspnea, chest pain. Had several episodes of diarrhea yesterday after getting lactulose  - denies abdominal distention    SCHEDULED MEDICATIONS:   .  atorvastatin, 40 mg, Daily  .  carVEDilol, 6.25 mg, BID  .  cefdinir, 300 mg, BID  .  [Held By Provider] enoxaparin, 40 mg, q HS  .  furosemide, 40 mg, Daily  .  lactulose, 20 g, Daily  .  magnesium sulfate, 3 g, Once  .  nicotine, 1 patch, Daily  .  omeprazole, 20 mg, Daily empty stomach  .  sodium chloride, 20 mL, Daily  .  sodium phosphate, 40 mEq, Once  .  [Held By Provider] spironolactone, 100 mg, Daily  .  thiamine mononitrate, 100 mg, Daily  .  traMADol, 50 mg, BID    INFUSED MEDICATIONS:       PRN MEDICATIONS:  dextrose, 50-75 mL/hr, PRN  .  dextrose, 125 mL, PRN  .  diphenhydrAMINE, 25 mg, q4h PRN  .  fentaNYL PF, 12.5-50 mcg, q5 min PRN  .  glucagon, 0.5 mg, PRN  .  glucagon, 1 mg, PRN  .  haloperidol lactate, 1 mg, Once PRN  .  HYDROmorphone, 0.2-0.4 mg, q5 min PRN  .  HYDROmorphone, 1-2 mg, q3h PRN  .  labetalol, 10 mg, Q2 Hours PRN  .  labetalol, 5 mg, q5 min PRN  .  lactated ringers, 500 mL, Once PRN  .  lactulose, 20 g, Daily PRN  .  morphine, 1-4 mg, q4h PRN  .  naloxone, 0.04 mg, q2 min PRN  .  ondansetron, 4 mg, q10 min PRN  .  ondansetron, 4 mg, q8h PRN       OBJECTIVE     Vitals (Most recent in last 24 hrs)     T: 36.4 C (11/11/20 0958)  BP: (!) 142/84 (11/11/20 0958)  HR: 66 (11/11/20  0958)  RR: 18 (11/11/20 1014)  SpO2: 97 % (11/11/20 1014) Room air  T range: Temp  Min: 36.1 C  Max: 36.8 C  Admit weight: 60.6 kg (133 lb 9.6 oz) (11/05/20 0556)  Last weight: 60.6 kg (133 lb 9.6 oz) (11/05/20 0556)       I&Os:     Intake/Output Summary (Last 24 hours) at 11/11/2020 1026  Last data filed at 11/11/2020 6295  Intake 1765 ml   Output 750 ml   Net 1015 ml       Physical Exam  GENERAL: laying in bed in NAD  EYES: Anicteric, EOMI  ENT: trach in place. NGT in place  CV: RRR. Normal S1, S2. No mrg  RESP: Unlabored breathing. CTA b/l  GI:  Abd Soft, NTND. G tube in place, mildly tender around insertion site  MSK: WWP. No LE edema  Skin: warm, dry, no rashes  NEURO: Alert, oriented. Face symmetric. Moves all limbs. No  asterixes  PSYCH: Pleasant, appropriate affect     Labs (last 24 hours):   Chemistries  CBC  LFT  Gases, other   135 98 20 90   9.4   AST: 19 ALT: 12  -/-/-/-  -/-/-/-   4.0 30 0.79   6.13 >< 73  AP: 97 T bili: 1.5  Lact (a): - Lact (v): -   eGFR: >60 Ca: 7.9   29   Prot: 5.7 Alb: 2.9  Trop I: - D-dimer: -   Mg: 1.8 PO4: 2.7  ANC: -     BNP: - Anti-Xa: -     ALC: -    INR: -            ASSESSMENT/PLAN      Cody Clark is a 65 year old male with hx CAD, cirrhosis, HTN, recurrent laryngeal cancer who presented with airway compromise at OSH s/p intubation transferred here to [facility] now s/p tracheostomy on 7/12.    #Laryngeal cancer s/p tracheostomy  - management per primary team  - NGT in place. Is planned for PEG on 7/18    #HTN  #HLD  #CAD  NSTEMI in 2019 and STEMI in 2021 with stent placement in June 2021  - cont atorvastatin, carvedilol 6.29m BID. 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 Currently sober. Currently compensated. Hx of ascites well controlled on lasix 40/spironolactone 100  - cont home lasix 453m - can restart home spironolactone 10024momorrow AM. Low concern of hyperkalemia as repeat K yesterday PM was normal despite  getting spironolactone yesterday.   - cont Lactulose 20 grams daily    #Hypophosphatemia  #Hypomagnesemia  #Nutrition  - cont to replete.     #Thrombocytopenia  Suspect in the setting of his underlying cirrhosis. No evidence of active bleeding. CT A/P done on 05/14/20 with evidence of splenomegaly  - hold ASA and pharmacology DVT ppx given platelets <50k  - okay to restart ASA 81 on discharge if plt>50k  - daily CBC    #R acromioclavicular septic arthritis   #Group B Strep Septicemia   Hx of septic arthritis and group G bacteremia s/p removal of distal third clavicle and debridement on 05/15/20. 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 He received 2 months of IV abx previously and was seen in ID clinic on 10/09/20 who recommended 2 additional months of PO cefdinir 300m54mD  - cont cefdnir 300mg78m    #Tobacco use disorder  - continue nicotine patch    #GERD  -Omeprazole 20mg 33my    Thank you for involving us in [place] patient's care. Please reach Primary Contact in [place] with additional questions/concerns.We will continue to follow peripherally

## 2020-11-11 NOTE — Progress Notes (Signed)
11/11/20 1545   Vital Signs   Pulse 70   Heart Rate Source Monitor   Oxygen Therapy   SpO2 94 %   Oxygen Therapy None (Room air)   O2 Delivery Method Trach mask   FiO2 (%) 21 %   O2 Flow Rate (L/min) 10 L/min   Patient Activity At rest   ROX Index 24.87   Respiratory Assessment   Assessment Type Assess only   Resp 18   Respiratory Pattern Normal   Chest Assessment Symmetrical   Cough Strong;Able to clear secretions   Bilateral Breath Sounds Clear;Diminished   Airways   Airway LDA Tracheostomy   Surgical Airway 11/05/20 Shiley 6   Placement Date/Time: 11/05/20 1718   Surgical Airway Type: Tracheostomy  Brand: Shiley  Style: Cuffed  Size (mm): 6   Cuff Status Other (Comment)  (cuffless)   Site Assessment Clean;Dry   Site Care Dried;Cleansed;Dressing applied   Inner Cannula Care Cleansed/dried   Ties Assessment Clean;Dry;Intact     Pt continued on ATC with above settings. Trach care done. No skin breakdown. Spare trach and ambu at bedside. Continue to monitor.

## 2020-11-11 NOTE — Procedure Nursing Note (Addendum)
IR Post-Procedure Note    NEHEMIAH MCFARREN tolerated G tube placement without immediate complications. Procedure performed by Kathy Breach, MD & Dr. Mallie Mussel    Lab Results   Component Value Date    WBC 6.13 11/11/2020    HEMOGLOBIN 9.4 (L) 11/11/2020    HEMATOCRIT 29 (L) 11/11/2020    PLATELET 73 (L) 11/11/2020    SODIUM 135 11/11/2020    POTASSIUM 4.0 11/11/2020    BUN 20 11/11/2020    CREATININE 0.79 11/11/2020    GLUCOSE 90 11/11/2020    INR 1.4 (H) 11/05/2020    PROTIME 16.9 (H) 11/05/2020    PTT 27 11/05/2020    AST 19 11/11/2020    ALT 12 11/11/2020    ALK 97 11/11/2020    ALBUMIN 2.9 (L) 11/11/2020    BILIRUBN 1.5 (H) 11/11/2020       Patient was under anesthesia care, please see anesthesia charting/flowsheet for more details. Dressing to puncture site C/D/I. Report given at bedside to PACU RN.     Discharged from IR to PACU by anesthesia provider. Patient escort: No one available.    BP 113/78 (Patient Position: Lying)   Pulse (!) 57   Temp 36.1 C (Temporal)   Resp 16   Ht 5' 7.4" (1.712 m)   Wt 60.6 kg (133 lb 9.6 oz)   SpO2 99%   BMI 20.68 kg/m      [x]  Procedure end time: 0900  [x]  Post procedure orders verified

## 2020-11-11 NOTE — Brief Procedure Note (Signed)
IR Post Procedure Assessment    Procedure:  Gastrostomy tube    Operators:    Benay Pike (Attending)  - Anahy Esh (Resident)    Findings:  Normal anatomy, G-tube placed    Specimen/Sample: None     Drains: 16 French MIC    Complications: none    Estimated blood loss:  < 25 mL    Post Procedure Diagnosis:  Oropharyngeal cancer     Plan/Recommendations:      Patient is status post percutaneous Gastrostomy tube placement.     Plan:  Percutaneous gastrostomy tube to remain connected to gravity drainage bag for first 4 hours.      Do not start any tube feeds until IR clears patient.     IR resident to clear patient for feedings if output is not excessive and abdominal exam is benign. If patient develops severe abdominal pain please stop tube feedings immediately.      Pexy clips are in-place and need to be removed 10 to 14 days after procedure. IR will arrange for these to be removed.

## 2020-11-11 NOTE — Anesthesia Preprocedure Evaluation (Signed)
Patient: Cody Clark    Procedure Information     Anesthesia Start Date/Time: 11/11/20 2353    Scheduled providers: Beryl Meager, MD; Hulda Humphrey, CRNA; Marylu Lund, MD; Ethelene Browns, MD; Florencia Reasons, RN    Procedure: IR GASTROJEJUNOSTOMY G-J TUBE PLACEMENT    Location: [facility] Interventional Radiology; [facility] Radiology        HPI: 65 yo with hx of head/neck CA s/p chemo rads, intubated for difficulty breathing at OSH, apparently difficult airway due to cancer recurrence. Evidence of ongoing demand ischemia vs potentially evolving NSTEMI. Plan is trach.    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 aspirin 81 mg, Oral, Daily   . atorvastatin (LIPITOR) 40 mg, Oral, Daily   . carVEDilol (COREG) 6.25 mg, Oral, 2 times daily   . cefdinir (OMNICEF) 300 mg, Oral, Every 12 hours scheduled   . furosemide 40 MG tablet Oral   . lactulose 20 g, Oral, Daily PRN   . melatonin 3 mg, Oral, Nightly PRN   . nicotine 14 MG/24HR patch 1 patch, Transdermal, Daily   . omeprazole (PRILOSEC) 20 mg, Oral, Daily empty stomach   . oxyCODONE 5 mg, Oral, Every 8 hours PRN   . spironolactone (ALDACTONE) 100 mg, Oral, Daily        Inpatient:   Scheduled   REM      Continuous  REM      PRN  REM          Review of patient's allergies indicates:  Allergies   Allergen Reactions   . Apap [Acetaminophen] Unknown     Reported from OSH   . Pcn [Penicillins] Unknown     Reported from OSH     . Vancomycin Unknown     Reported from OSH         Social History:     Medical History and Review of Systems  Documentation reviewed: electronic medical record.    Source of information: In person visit and Chart review.  Previous  anesthesia: Yes   Pulmonary Laryngeal ca s/p R/T C/T s/p trach. Stale respiratory function now.    Cardiovascular   (+) CAD    GI/Hepatic/Renal   (+) liver disease  (+) cirrhosis            Physical Exam  Airway  Unable to Assess    Pre-existing airway:  Tracheostomy    Dental    Cardiovascular  normal    Rhythm:  Regular  Rate:  Normal    Pulmonary  normal             Labs: (last year)    BMP  CBC/Coags   Na 135 11/11/2020  Hb 9.4 (L) 11/11/2020   K 4.0 11/11/2020  HCT 29 (L) 11/11/2020   Cl 98 11/11/2020  WBC 6.13 11/11/2020   HCO3 30 11/11/2020  PLT 73 (L) 11/11/2020   BUN 20 11/11/2020  INR 1.4 (H) 11/05/2020   Cr 0.79 11/11/2020  PT 16.9 (H) 11/05/2020   Glu 90 11/11/2020  PTT 27 11/05/2020  Misc   eGFR >60 11/11/2020  MCV 80 (L) 11/11/2020   A1C 6.2 (H) 11/06/2020  BNP - -       LFTs   AST 19 11/11/2020  Albumin 2.9 (L) 11/11/2020   ALT 12 11/11/2020  Protein 5.7 (L) 11/11/2020   Alk Phos 97 11/11/2020  T Bili 1.5 (H) 11/11/2020      VBG    11/05/2020   pH PvCO2 PvO2 HCO3 Lactate   7.38 47 50 (H) 28 (H) -         Relevant procedures / diagnostic studies:     PAT CLINIC DISCUSSION      ANESTHESIA PLAN   ASA Score:     ASA: 3  Planned Anesthetic Type:      MAC      Risk Calculators / Scores:     PONV: Low Risk  Total Score: 1            Intended opioid administration        Criteria that do not apply:    Male patient    Non-smoker    History of PONV    History of motion sickness

## 2020-11-11 NOTE — Progress Notes (Signed)
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    He presented to OSH with airway compromise, intubated and transferred here to [facility], now s/p trach 11/05/20    SUBJECTIVE:  - AFVSS   - NAEON  - Plts down to 44 (54 <- 33 <- 34 <- 38) PM labs and 1U plts given. Plts now 73 this AM (07/18).  - NPO since midnight for IR procedure today (7/18)    OBJECTIVE:  BP (!) 110/58 (Patient Position: Lying)   Pulse 63   Temp 36.6 C (Temporal)   Resp 16   Ht 5' 7.4" (1.712 m)   Wt 60.6 kg (133 lb 9.6 oz)   SpO2 98%   BMI 20.68 kg/m     GEN: resting comfortably in bed, no acute distress, writing notes to communicate  HEENT:  Eye:  Very subtle R eyelid droop, PERRLA, CN V1-3 intact, CNVII intact.    Neck: 6-0 shiley cuffless in place. Minimal amount of mucus around flanges. Skin around trach stoma is clean, dry and with no breakdown.  Resp: Comfortable on 21% humidified trach collar  CV:  Well perfused  NEURO:  Awake and alert, Ox4    Notable Labs (7/18):  - Mg - 1.8  - Phos - 2.7  - Plts - 73  - WBC - 12    ASSESSMENT/PLAN:  65 yo M w/ a history of alcohol cirrhosis, CAD, and prior L oropharyngeal cancer treated w/ surgical resection Annett Gula 2011) and chemoradiation. 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 POD1 tracheostomy, stable.  Admitted OSH with respiratory compromise, intubated and transferred to [facility], now S/p Trach 11/05/20.    POD#5:  #laryngeal cancer s/p tracheostomy  - Continue HTC  - RT to assist with trach teaching  - Trach changed to 6.0 Shiley Cuffless (07/16)    #malnutrition  - Monitor  for re-feeding syndrome and replete as necessary  - K > 4, Mg > 2 and Phos > 3   - Phos and Mg repleted (07/18)    #dysphagia  - Tolerating continuous tube feeds via NG tube   - Advanced to bolus feeds (7/16)   - NPO since midnight (7/18)   - Monitor electrolytes for refeeding syndrome   -   - IR to place G-tube (7/18)   - Plts - 44 at PM labs (7/17)   - 1U platelets given  - Discontinue Q6hr finger stick glucose checks    #Cirrhosis- appreciate med con recs  - Plts - 73 (07/18). With IR for G-tube placement.  - continue lactulose  - Restart Lasix (07/15)  - Hold spironolactone given hyperkalemia (K = 5.3) (07/17)   - Whole blood calcium PM labs (7/17) - 4.4    #DVT Prophylaxis  - Continue to hold enoxaparin (plts 44 7/17 PM)    #PT/OT  - Consult to work with patient    #Dispo  - Home vs. Resp SNF (discharge planning still in process)  - Anticipate d/c with trach and gtube supplies next week    Tania Ade, MD  Resident  PGY-1  Otolaryngology - Head and  Neck Surgery

## 2020-11-12 ENCOUNTER — Encounter (HOSPITAL_COMMUNITY): Payer: Self-pay

## 2020-11-12 ENCOUNTER — Telehealth (HOSPITAL_BASED_OUTPATIENT_CLINIC_OR_DEPARTMENT_OTHER): Payer: Self-pay | Admitting: Registered Nurse

## 2020-11-12 DIAGNOSIS — R0603 Acute respiratory distress: Secondary | ICD-10-CM

## 2020-11-12 LAB — COMPREHENSIVE METABOLIC PANEL
ALT (GPT): 11 U/L (ref 10–48)
AST (GOT): 17 U/L (ref 9–38)
Albumin: 2.9 g/dL — ABNORMAL LOW (ref 3.5–5.2)
Alkaline Phosphatase (Total): 93 U/L (ref 37–159)
Anion Gap: 6 (ref 4–12)
Bilirubin (Total): 1.6 mg/dL — ABNORMAL HIGH (ref 0.2–1.3)
Calcium: 8 mg/dL — ABNORMAL LOW (ref 8.9–10.2)
Carbon Dioxide, Total: 30 meq/L (ref 22–32)
Chloride: 100 meq/L (ref 98–108)
Creatinine: 0.78 mg/dL (ref 0.51–1.18)
Glucose: 99 mg/dL (ref 62–125)
Potassium: 4.7 meq/L (ref 3.6–5.2)
Protein (Total): 5.6 g/dL — ABNORMAL LOW (ref 6.0–8.2)
Sodium: 136 meq/L (ref 135–145)
Urea Nitrogen: 19 mg/dL (ref 8–21)
eGFR by CKD-EPI 2021: >60 mL/min/{1.73_m2} (ref >59)

## 2020-11-12 LAB — CBC (HEMOGRAM)
Hematocrit: 30 % — ABNORMAL LOW (ref 38.0–50.0)
Hemoglobin: 9.4 g/dL — ABNORMAL LOW (ref 13.0–18.0)
MCH: 25.2 pg — ABNORMAL LOW (ref 27.3–33.6)
MCHC: 31.6 g/dL — ABNORMAL LOW (ref 32.2–36.5)
MCV: 80 fL — ABNORMAL LOW (ref 81–98)
Platelet Count: 76 10*3/uL — ABNORMAL LOW (ref 150–400)
RBC: 3.73 10*6/uL — ABNORMAL LOW (ref 4.40–5.60)
RDW-CV: 22 % — ABNORMAL HIGH (ref 11.6–14.4)
WBC: 6.4 10*3/uL (ref 4.3–10.0)

## 2020-11-12 LAB — PHOSPHATE: Phosphate: 2.7 mg/dL (ref 2.5–4.5)

## 2020-11-12 LAB — MAGNESIUM: Magnesium: 2.2 mg/dL (ref 1.8–2.4)

## 2020-11-12 MED ORDER — PHOSPHA 250 NEUTRAL 155-852-130 MG OR TABS
1.0000 | ORAL_TABLET | Freq: Every day | ORAL | Status: AC
Start: 2020-11-12 — End: 2020-11-12
  Administered 2020-11-12: 1 via GASTROSTOMY
  Filled 2020-11-12: qty 1

## 2020-11-12 MED ORDER — FUROSEMIDE 40 MG OR TABS
40.0000 mg | ORAL_TABLET | Freq: Every day | ORAL | 2 refills | Status: AC
Start: 2020-11-12 — End: ?

## 2020-11-12 MED ORDER — SODIUM PHOSPHATES 45 MMOLE/15ML IV SOLN
40.0000 meq | Freq: Once | INTRAVENOUS | Status: DC
Start: 2020-11-12 — End: 2020-11-12
  Filled 2020-11-12: qty 10

## 2020-11-12 NOTE — Progress Notes (Addendum)
Equipment Ordered        Vendor Preference: [x]  No   [_] Yes   Preferred vendor:     Equipment:  [x]  Trach supplies, trach mist and portable suction    Portable suction will be delivered to the [facility] and all other equipment and supplies will be delivered to the home.      Equipment ordered through:  [facility], [place]  Address: [address], [place] [address]  Phone: [phone]    11/13/20: [employer] and confirmed that they delivered the trach supplies and trach mist to the pt's home yesterday.      Please contact Respiratory Therapy with any clinical concerns.     Bing Plume, Designer, multimedia   [facility] Rehab Therapy Equipment Coordination  I am available Monday through Friday 8:30 - 5:00  Phone: [phone] [phone]

## 2020-11-12 NOTE — Progress Notes (Signed)
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    He presented to OSH with airway compromise, intubated and transferred here to [facility], now s/p trach 11/05/20    SUBJECTIVE:  - AFVSS   - NAEON  - Plts up to 76 (73 <- 44 <- 54 ) this AM (07/19).    OBJECTIVE:  BP 95/64 (Patient Position: Lying)   Pulse 68   Temp 36.9 C (Temporal)   Resp 16   Ht 5' 7.4" (1.712 m)   Wt 60.6 kg (133 lb 9.6 oz)   SpO2 96%   BMI 20.68 kg/m     GEN: resting comfortably in bed, no acute distress, writing notes to communicate  HEENT:  Eye:  Very subtle R eyelid droop, PERRLA, CN V1-3 intact, CNVII intact.    Neck: 6-0 shiley cuffless in place. Minimal amount of mucus around flanges. Skin around trach stoma is clean, dry and with no breakdown.  Resp: Comfortable on 21% humidified trach collar  CV:  Well perfused  NEURO:  Awake and alert, Ox4    Notable Labs (7/19):  - Mg - 2.2  - Phos - 2.7  - Plts - 76  - WBC - 11    ASSESSMENT/PLAN:  65 yo M w/ a history of alcohol cirrhosis, CAD, and prior L oropharyngeal cancer treated w/ surgical resection Annett Gula 2011) and chemoradiation. 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 POD1 tracheostomy, stable.  Admitted OSH with respiratory compromise, intubated and transferred to [facility], now S/p Trach 11/05/20.    POD#5:  #laryngeal cancer s/p tracheostomy  - Continue HTC  - RT to assist with trach teaching  - Trach changed to 6.0 Shiley Cuffless (07/16)    #malnutrition  - Monitor for re-feeding syndrome and replete as necessary  - K > 4, Mg > 2 and Phos > 3   - Phos - 2.7 (7/19) -  Repleted    #dysphagia  - Tolerating bolus tube feeds via G tube    - Resumed bolus feeds (07/19)   Bolus tube feeding recs:              - 330 mL Jevity 1.5 QID. Infuse over 2 hours to start (rate 165 mL/hr) and increase as  pt tolerates.               - Flush tube w 100 mL free water before/after each bolus for hydration.    - Monitor electrolytes for refeeding syndrome  - Discontinue Q6hr finger stick glucose checks    #Cirrhosis- appreciate med con recs  - Plts - 76 (07/19)  - continue lactulose  - Restart Lasix (07/15)  - Restart spironolactone (07/19)  - Resume ASA 81 mg if Plts > 50 at d/c    #DVT Prophylaxis  - Continue to hold enoxaparin (plts 76 7/19)    #PT/OT  - Consult to work with patient    #Dispo  - Home today with trach and feeding tube supplies    Tania Ade, MD  Resident  PGY-1  Otolaryngology - Head and Neck Surgery

## 2020-11-12 NOTE — Telephone Encounter (Signed)
[facility] called want to verify prescription. Please call back. thank you./gaa

## 2020-11-12 NOTE — Nursing Note (Signed)
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 Trach Shiley #6 cuffless. Moderate amt of secretions, able to clear w/ strong cough, using yankauer independently. Mod neck and abdominal pain managed w/ PRN dilaudid. Pt reported tramadol makes him lightheaded and does not want to take it anymore. NPO, G-tube placed yesterday, small bloody drainage, ok to use. All meds crushed through G-tube. Abdominal binder on. Bolus TF's QID through kangaroo pump, tolerating well. Up ind, voiding naturally in BR. +BM, +flatus. Plan to D/C this afternoon.  Edited by: Mel Almond, RN at 11/12/2020 0559    Illness Severity  Stable.  Edited by: Levester Fresh, RN at 11/11/2020 [phone]

## 2020-11-12 NOTE — Telephone Encounter (Signed)
Asked for clarification on multiple prescriptions sent to [place] with different medications routes. Verified that route should be thru tube for medication intake and not oral.

## 2020-11-12 NOTE — Progress Notes (Signed)
Progress Note     Cody Clark") - DOB: November 26, 1955 65 year old male)  Preferred Pronouns: patient's name  Admit Date: 11/05/2020  Code Status: Full Code       CHIEF CONCERN / IDENTIFICATION:  Cody Clark is a 65 year old male with hx CAD, cirrhosis, HTN, recurrent laryngeal cancer who presented with airway compromise at OSH s/p intubation transferred here to [facility] now s/p tracheostomy on 7/12.    Medicine consulted for assistance with management of comorbidities.      SUBJECTIVE   INTERVAL HISTORY:  - tolerating tube feeds through G tube. Looking forward to going home  - denies N/V, chest pain, dyspnea, LE edema    SCHEDULED MEDICATIONS:   .  atorvastatin, 40 mg, Daily  .  carVEDilol, 6.25 mg, BID  .  cefdinir, 300 mg, BID  .  [Held By Provider] enoxaparin, 40 mg, q HS  .  furosemide, 40 mg, Daily  .  lactulose, 20 g, Daily  .  nicotine, 1 patch, Daily  .  omeprazole, 20 mg, Daily empty stomach  .  potassium phosphate-sodium phosphate, 1 tablet, Daily  .  sodium chloride, 20 mL, Daily  .  spironolactone, 100 mg, Daily  .  thiamine mononitrate, 100 mg, Daily  .  traMADol, 50 mg, BID    INFUSED MEDICATIONS:       PRN MEDICATIONS:  dextrose, 50-75 mL/hr, PRN  .  dextrose, 125 mL, PRN  .  diphenhydrAMINE, 25 mg, q4h PRN  .  fentaNYL PF, 12.5-50 mcg, q5 min PRN  .  glucagon, 0.5 mg, PRN  .  glucagon, 1 mg, PRN  .  haloperidol lactate, 1 mg, Once PRN  .  HYDROmorphone, 0.2-0.4 mg, q5 min PRN  .  HYDROmorphone, 1-2 mg, q3h PRN  .  labetalol, 10 mg, Q2 Hours PRN  .  labetalol, 5 mg, q5 min PRN  .  lactated ringers, 500 mL, Once PRN  .  lactulose, 20 g, Daily PRN  .  morphine, 1-4 mg, q4h PRN  .  naloxone, 0.04 mg, q2 min PRN  .  ondansetron, 4 mg, q10 min PRN  .  ondansetron, 4 mg, q8h PRN       OBJECTIVE     Vitals (Most recent in last 24 hrs)     T: 36.6 C (11/12/20 0758)  BP: 99/84 (11/12/20 0758)  HR: 77 (11/12/20 0758)  RR: 15 (11/12/20 0805)  SpO2: 95 % (11/12/20 0805) Room air  T range: Temp  Min:  36.4 C  Max: 37.2 C  Admit weight: 60.6 kg (133 lb 9.6 oz) (11/05/20 0556)  Last weight: 60.6 kg (133 lb 9.6 oz) (11/05/20 0556)       I&Os:     Intake/Output Summary (Last 24 hours) at 11/12/2020 0916  Last data filed at 11/12/2020 0900  Intake 1430 ml   Output --   Net 1430 ml       Physical Exam  GENERAL: laying in bed in NAD  EYES: Anicteric, EOMI  ENT: trach in place.  CV: RRR. Normal S1, S2. No mrg  RESP: Unlabored breathing. CTA b/l  GI:  Abd Soft, NTND. G tube in place, mildly tender around insertion site  MSK: WWP. No LE edema  Skin: warm, dry, no rashes  NEURO: Alert, oriented. Face symmetric. Moves all limbs. No asterixes  PSYCH: Pleasant, appropriate affect     Labs (last 24 hours):   Chemistries  CBC  LFT  Gases, other   136 100 19 99   9.4   AST: 17 ALT: 11  -/-/-/-  -/-/-/-   4.7 30 0.78   6.40 >< 76  AP: 93 T bili: 1.6  Lact (a): - Lact (v): -   eGFR: >60 Ca: 8.0   30   Prot: 5.6 Alb: 2.9  Trop I: - D-dimer: -   Mg: 2.2 PO4: 2.7  ANC: -     BNP: - Anti-Xa: -     ALC: -    INR: -            ASSESSMENT/PLAN      Cody Clark is a 65 year old male with hx CAD, cirrhosis, HTN, recurrent laryngeal cancer who presented with airway compromise at OSH s/p intubation transferred here to [facility] now s/p tracheostomy on 7/12.    #Laryngeal cancer s/p tracheostomy  - management per primary team  - s/p PEG 7/18    #HTN  #HLD  #CAD  NSTEMI in 2019 and STEMI in 2021 with stent placement in June 2021  - cont atorvastatin, carvedilol 6.79m BID. 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 Currently sober. Currently compensated. Hx of ascites well controlled on lasix 40/spironolactone 100  - cont home lasix 4028mspironolactone 100m58m cont Lactulose 20 grams daily. Goal 2-3 BM/day    #Hypophosphatemia  #Hypomagnesemia  #Nutrition  - cont to replete.     #Thrombocytopenia  Suspect in the setting of his underlying cirrhosis. No evidence of active bleeding. CT A/P done  on 05/14/20 with evidence of splenomegaly. S/p 1u plt on 7/18 prior to PEG placement. Plts remain steady in the 70k's this AM  - okay to restart ASA 81 on discharge  - daily CBC    #R acromioclavicular septic arthritis   #Group B Strep Septicemia   Hx of septic arthritis and group G bacteremia s/p removal of distal third clavicle and debridement on 05/15/20. 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 He received 2 months of IV abx previously and was seen in ID clinic on 10/09/20 who recommended 2 additional months of PO cefdinir 300mg62m  - cont cefdnir 300mg 45m   #Tobacco use disorder  - continue nicotine patch    #GERD  -Omeprazole 20mg d49m    Thank you for involving us in [place] patient's care. Please reach Primary Contact in [place] with additional questions/concerns.We will sign off now

## 2020-11-12 NOTE — Progress Notes (Signed)
Social worker following up on home health referrals.    Patient was discussed in Progression of Care rounds with Otolaryngology ARNP Tito Dine).  Plan is to discharge patient today.    Yesterday Education officer, museum sent referrals to 6 HH (home health) agencies.  Currently there are no accepting [facility] agencies; 2 agencies still reviewing and 4 have denied patient.  1. [facility] [place]:  Currently under review.  Per Intake Dorothea Ogle), having this reviewed.  2. [facility] [phone].  This worker contacted intake and left a voice message requesting for referral to be reviewed.  3. [facility].  DENIED.  Per Intake Freda Munro), denied due to not able to manage trachs.  [facility].  DENIED.  Per Intake Opal Sidles), unable to accept within the service area at this time due to limited resources.    [facility].  DENIED.  Per Intake Malachy Mood), unable to accept trach patient.  6. [facility]:  DENIED.  Received a call from Intake Coordinator Barnabas Lister) that they are unable to provide trach care.    Social worker provided this update to Ms. Dow Adolph and 4NE Nurse Leonides Sake).  Per Ms. Dow Adolph, it is safe for patient to return home without home health services.    This worker will continue to follow-up with [facility] and [facility] on acceptance and start of care.

## 2020-11-12 NOTE — Discharge Instructions (Addendum)
_______________________________________________________________________________________________    [facility] [place]  Address: [address], [place] [address]  Phone: [phone]  [email or web address]    Trach supplies, trach mist, portable suction     ___________________________________________________________________________________________________

## 2020-11-13 NOTE — Discharge Summary (Unsigned)
Discharge Summary     Patient Name: Cody Clark, Cody Clark.  Date of Service: November 12, 2020    Patient ID: [id number] Date of Birth: Feb 22, 1956    Clinician: [place]: [facility]   Location: [facility]         ADMITTING PHYSICIAN:  Dr. Catalina Pizza     ADMITTING DIAGNOSES:    1.  Squamous cell carcinoma of the larynx.  2.  Respiratory distress related to tumor progression.       PROCEDURE FROM 07/12 IS THE FOLLOWING:  Open tracheostomy tube with a #6 Shiley tube.     ALLERGIES:    THE PATIENT HAS KNOWN ALLERGY TO TYLENOL, PENICILLIN, AND VANCOMYCIN.     DISCHARGE MEDICATIONS ARE THE FOLLOWING:    Hydromorphone 2 mg tablets 1-2 tablets per feeding tube q.4-6 hours p.r.n. pain, tramadol 50 mg tablet 1 tablet per feeding tube b.i.d., aspirin 81 mg per feeding tube daily, atorvastatin 40 mg per feeding tube daily, carvedilol 6.25 mg per feeding tube b.i.d., cefdinir 300 mg per feeding tube q.12 hours x1 month, furosemide 40 mg tablet per feeding tube daily, lactulose 10 g/mL 20 g per feeding tube to be taken for constipation, melatonin 3 mg per feeding tube at bedtime p.r.n. insomnia, nicotine patch 14 mcg per 24-hour patch 1 patch to the skin daily, omeprazole 20 mg to be taken per feeding tube daily, oxycodone 5 mg tablet per feeding tube q.8 hours p.r.n. pain, spironolactone 100 mg per feeding tube daily, thiamine mononitrate 100 mg tablet 1 tablet per feeding tube for another 5 days.     ACTIVITY:    As tolerated.     DIET:    The patient had a gastric feeding tube placed on Monday, 07/18 for the purpose of nutrition.  He is currently on tube feeding Jevity 1.5, 330 mL per feeding tube q.i.d. with a water bolus of 100 mL before and after each tube feeding.       WOUND CARE:  1.  As noted, the patient has a tracheostomy tube that was placed on 07/12.  The tracheostomy tube was directed by Respiratory Therapy.  2.  To gastric feeding tube site:  Clean around feeding tube site daily with soap and water.      HISTORY OF PRESENT ILLNESS:    Cody Clark is a 65 year old gentleman with a previous history of oral cavity cancer.  The patient was seen and treated under Dr. Frann Rider care in the year 2011.  He underwent treatment close to his home in [place], [place].  In January of this year, he developed a new right laryngeal piriform sinus mass.  It was biopsy positive for poorly differentiated carcinoma by Dr. Rosezella Florida in [place], [place].  He is currently staged as a T4a N1 M1 carcinoma.  He was planned to initiate therapy with his oncology providers at [facility].  Due to the fact that he had episodes of worsening respiratory distress, he was transported from [place] to the [facility] on 07/12 for the purpose of proceeding with tracheostomy tube placement.  During the time he was here in [place], he underwent evaluation, and the decision was made to place a gastric feeding tube as well.  His condition was stabilized, and he has since discharged back home.     HOSPITAL COURSE:  Overall, Cody Clark has done reasonably well.  As noted, his tracheostomy tube was placed with no difficulty on 07/12.  He initially had  a cuffed tracheostomy tube which was transitioned to a cuffless tracheostomy tube the weekend prior to his departure to home.  The site has remained stable.  The patient has been seen and undergone teaching with Respiratory Therapy throughout his hospitalization.  At the time of discharge from the hospital, he is caring for his tracheostomy tube independently and doing well.  We worked with one of our Runner, broadcasting/film/video at the [employer], and all of his respiratory supplies were set up by [employer] and will be transitioned back to his home in [place], [place].     The patient as noted had a gastric feeding tube placed by Interventional Radiology on 07/18.  He had a nasogastric feeding tube placed at the time of admission due to concern with significant  dysphagia.  He has been tolerating his tube feeding with no difficulty.  We did work with our home infusion staff, and he has been set up with [facility] for tube feeding supplies and teaching.  All his supplies will be transitioned to his home in [place], [place].       Of note, the patient has established providers close to his home.  I have been in contact with the staff at [facility].  I have since been in contact with the nursing staff who work with Dr. Kathrine Cords and his radiation oncologist Dr. Freida Busman at the date of his departure from the [facility] to let them know that he has since returned to his home.  I also contacted Dr. Doren Custard office to let them know that he has since returned to his home, and Dr. Rosezella Florida should be able to manage this gentleman's tracheostomy tube in the future.     One other concern for this gentleman is that he does live on his own.  He did have a care provider through the [place] program.  Per one of the nursing staff in the [facility], it was most likely Dr. Bartolo Darter who is his primary care provider who had set up for the care provider through [place].  I was not able to contact anyone with Dr. Ronnie Derby office.  I will work within the week of his discharge to inform this individual that Cody Clark has returned to his home.  Followup in the [facility] system will be on an as needed basis.                                                                             [place]      Date Dictated: 11/12/2020    Date Transcribed: 11/13/2020    CAS/cj   Job #: [id number]

## 2020-11-14 NOTE — Progress Notes (Signed)
Social worker following up on home health referra to [facility] [place].  Discussed patient with Onsite Clinical Liaison Dorothea Ogle Amery [phone]) at [place].  Per Dorothea Ogle, referral was reviewed and they are considering accepting patient if primary team is able to add SLP and PT to the referral.  Per Dorothea Ogle, their SLP is trained in trach care and their RN prefers to have SLP to provide trach care.  This information was relayed to Otolaryngology ARNP Tito Dine).  Ms. Dow Adolph will add SLP and PT to the orders and addend the [facility] Encounter Form.  This worker will send updated orders and F2F note to [facility].

## 2020-11-15 ENCOUNTER — Other Ambulatory Visit (HOSPITAL_BASED_OUTPATIENT_CLINIC_OR_DEPARTMENT_OTHER): Payer: Self-pay | Admitting: Registered Nurse

## 2020-11-19 NOTE — Progress Notes (Addendum)
Social worker following up on home health referral to [facility] [place].      Yesterday evening Otolaryngology ARNP Tito Dine) updated SLP and PT to orders and [facility] Encounter Form.  Today this worker sent updated orders, [facility] Home Health Face-to-Face Encounter Form, and discharge summer to [facility] via [employer].  Discussed patient with Onsite Clinical Liaison Dorothea Ogle Dana [phone]) at [place].  Per Dorothea Ogle, patient is accepted for home health RN/PT/SLP and services will start tomorrow 11/20/20.

## 2020-12-13 ENCOUNTER — Inpatient Hospital Stay: Payer: Self-pay

## 2020-12-13 ENCOUNTER — Emergency Department: Payer: Self-pay

## 2020-12-23 LAB — OTHER LAB ORDER
Hemoglobin A1C: 6.1 % — ABNORMAL HIGH (ref 4.0–5.6)
Mean Blood Glucose Estimate: 128 mg/dL (ref <=153)

## 2021-01-25 DEATH — deceased

## 2023-03-12 LAB — PATHOLOGY, SURGICAL
